# Patient Record
Sex: Female | Born: 1965 | Race: White | Hispanic: No | Marital: Married | Smoking: Never smoker
Health system: Southern US, Community
[De-identification: ages and names within clinical notes are randomized; demographics above are authoritative.]

## PROBLEM LIST (undated history)

## (undated) DIAGNOSIS — Z8669 Personal history of other diseases of the nervous system and sense organs: Secondary | ICD-10-CM

## (undated) DIAGNOSIS — G40909 Epilepsy, unspecified, not intractable, without status epilepticus: Secondary | ICD-10-CM

## (undated) DIAGNOSIS — I2699 Other pulmonary embolism without acute cor pulmonale: Secondary | ICD-10-CM

## (undated) DIAGNOSIS — R569 Unspecified convulsions: Secondary | ICD-10-CM

## (undated) DIAGNOSIS — H3530 Unspecified macular degeneration: Secondary | ICD-10-CM

## (undated) DIAGNOSIS — H409 Unspecified glaucoma: Secondary | ICD-10-CM

## (undated) HISTORY — DX: Unspecified macular degeneration: H35.30

## (undated) HISTORY — PX: PARTIAL HYSTERECTOMY: SHX80

## (undated) HISTORY — DX: Unspecified glaucoma: H40.9

## (undated) HISTORY — DX: Other pulmonary embolism without acute cor pulmonale: I26.99

## (undated) HISTORY — DX: Unspecified convulsions: R56.9

## (undated) HISTORY — PX: TUBAL LIGATION: SHX77

## (undated) HISTORY — DX: Personal history of other diseases of the nervous system and sense organs: Z86.69

---

## 2021-05-23 ENCOUNTER — Encounter: Payer: Self-pay | Admitting: Neurology

## 2021-07-01 ENCOUNTER — Ambulatory Visit (INDEPENDENT_AMBULATORY_CARE_PROVIDER_SITE_OTHER): Payer: 59 | Admitting: Neurology

## 2021-07-01 ENCOUNTER — Other Ambulatory Visit: Payer: 59

## 2021-07-01 ENCOUNTER — Encounter: Payer: Self-pay | Admitting: Neurology

## 2021-07-01 ENCOUNTER — Other Ambulatory Visit: Payer: Self-pay

## 2021-07-01 VITALS — BP 128/88 | HR 67 | Ht 64.0 in | Wt 168.0 lb

## 2021-07-01 DIAGNOSIS — G40309 Generalized idiopathic epilepsy and epileptic syndromes, not intractable, without status epilepticus: Secondary | ICD-10-CM

## 2021-07-01 DIAGNOSIS — R519 Headache, unspecified: Secondary | ICD-10-CM

## 2021-07-01 DIAGNOSIS — H53462 Homonymous bilateral field defects, left side: Secondary | ICD-10-CM

## 2021-07-01 LAB — COMPREHENSIVE METABOLIC PANEL
ALT: 10 U/L (ref 0–35)
AST: 16 U/L (ref 0–37)
Albumin: 4.2 g/dL (ref 3.5–5.2)
Alkaline Phosphatase: 69 U/L (ref 39–117)
BUN: 11 mg/dL (ref 6–23)
CO2: 26 mEq/L (ref 19–32)
Calcium: 9.2 mg/dL (ref 8.4–10.5)
Chloride: 99 mEq/L (ref 96–112)
Creatinine, Ser: 0.66 mg/dL (ref 0.40–1.20)
GFR: 97.94 mL/min (ref >60.00)
Glucose, Bld: 86 mg/dL (ref 70–99)
Potassium: 4 mEq/L (ref 3.5–5.1)
Sodium: 133 mEq/L — ABNORMAL LOW (ref 135–145)
Total Bilirubin: 0.4 mg/dL (ref 0.2–1.2)
Total Protein: 6.7 g/dL (ref 6.0–8.3)

## 2021-07-01 LAB — CBC
HCT: 36.3 % (ref 36.0–46.0)
Hemoglobin: 12.6 g/dL (ref 12.0–15.0)
MCHC: 34.7 g/dL (ref 30.0–36.0)
MCV: 89.6 fl (ref 78.0–100.0)
Platelets: 267 10*3/uL (ref 150.0–400.0)
RBC: 4.05 Mil/uL (ref 3.87–5.11)
RDW: 12.6 % (ref 11.5–15.5)
WBC: 4.8 10*3/uL (ref 4.0–10.5)

## 2021-07-01 MED ORDER — OXCARBAZEPINE 600 MG PO TABS: 600.0000 mg | ORAL_TABLET | Freq: Two times a day (BID) | ORAL | 3 refills | Status: DC

## 2021-07-01 MED ORDER — LAMOTRIGINE 100 MG PO TABS: 100.0000 mg | ORAL_TABLET | Freq: Two times a day (BID) | ORAL | 3 refills | Status: DC

## 2021-07-01 MED ORDER — NORTRIPTYLINE HCL 25 MG PO CAPS: 25.0000 mg | ORAL_CAPSULE | Freq: Every day | ORAL | 6 refills | Status: DC

## 2021-07-01 MED ORDER — TOPIRAMATE 50 MG PO TABS: 150.0000 mg | ORAL_TABLET | Freq: Two times a day (BID) | ORAL | 3 refills | Status: DC

## 2021-07-01 NOTE — Progress Notes (Signed)
NEUROLOGY CONSULTATION NOTE  Laurie Silva MRN: [id number] DOB: [birth year]  Referring provider: Dr. Rolene Arbour Primary care provider: [facility]  Reason for consult:  establish epilepsy care  Dear Dr Tera Helper:  Thank you for your kind referral of Laurie Silva for consultation of the above symptoms. Although her history is well known to you, please allow me to reiterate it for the purpose of our medical record. She is alone in the office today. Records and images were personally reviewed where available.   HISTORY OF PRESENT ILLNESS: This is a 55 year old right-handed woman with a history of pulmonary embolism, migraines, and generalized epilepsy, presenting to establish care. Records from her epileptologist Dr. Tera Helper were reviewed. She was previously followed by Dr. Jac Canavan and Dr. Layla Barter. Seizures started at age 31. She had staring spells as a child. She denies any myoclonic jerks. No warning prior to her convulsions, she states her last seizure was in 2008. EMU evaluation in 2007 was abnormal with generalized spike and wave discharges, at which time one GTCS was captured during the admission. There is a brain MRI with and without contrast done 06/2019 which was normal. She has tried multiple ASMs in the past, including Dilantin (ineffective), Phenobarbital (ineffective), Keppra (ineffective), Tegretol (initially worked), Neurontin, Zonegran, Zarontin, Depakote (rash), and Lamictal (rash). She had a long history of being on brand name medication but switched to generics due to economic issues with no side effects or breakthrough seizures. She has been on chronic treatment with combination Lamotrigine 100mg  BID, Oxcarbazepine 600mg  BID, and Topiramate 50mg  3 tabs BID (150mg  BID) without side effects. We discussed note on her chart of last seizure being in 2019, and clarified that it was not a seizure, she had a viral illness at that time and Dr. ' note in 12/2017  indicated "history most compatible with syncope related to viral illness and neuropathy making her unstable."  She lives with her husband and is his primary caregiver causing significant amount of stress. She denies any staring/unresponsive episodes, gaps in time, olfactory/gustatory hallucinations, deja vu, rising epigastric sensation, focal numbness/tingling/weakness, myoclonic jerks. She has a history of migraines and has had chronic daily headaches for at least a year, taking 2 Tylenol every night for the past year. Pain is diffuse, there is no associated nausea/vomiting. When pain is severe, she has sensitivity to lights and sounds. She has a headache now. She has occasional dizziness. She states her back always hurts. No bowel/bladder dysfunction. She brings notes from Dr. 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 Memory is "just like any 55 year old." She denies missing medications regularly, denies getting lost driving. She states there is a lot going on at home, she does not sleep, if she gets 3 hours of sleep she is good. 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   Epilepsy Risk Factors:  Her daughter had seizures at age 71 that she outgrew, her 79 year old granddaughter has epilepsy. She recalls a pneumonia with a fever of 107 at age 21 but does not recall febrile convulsions. She had a normal birth and early development.  There is no history of CNS infections such as meningitis/encephalitis, significant traumatic brain injury, neurosurgical procedures  Prior ASMs: Dilantin (ineffective), Phenobarbital (ineffective), Keppra (ineffective), Tegretol (initially worked), Neurontin, Zonegran, Zarontin, Depakote (rash), and Lamictal (rash)  Diagnostic Data: EEGs:EMU evaluation in  2007 was abnormal with generalized spike and wave discharges, at which time one GTCS was  captured during the admission.  LNL:GXQJJ is a brain MRI with and without contrast done 06/2019 which was normal.    PAST MEDICAL HISTORY: Past Medical History:  Diagnosis Date   Glaucoma    Hx of migraines    Macular degeneration    Pulmonary embolism (HCC)    Seizures (HCC)     PAST SURGICAL HISTORY: Past Surgical History:  Procedure Laterality Date   PARTIAL HYSTERECTOMY     TUBAL LIGATION      MEDICATIONS: Current Outpatient Medications on File Prior to Visit  Medication Sig Dispense Refill   aspirin 81 MG chewable tablet Chew 81 mg by mouth daily.     lamoTRIgine (LAMICTAL) 100 MG tablet Take 100 mg by mouth 2 (two) times daily.     oxcarbazepine (TRILEPTAL) 600 MG tablet Take 600 mg by mouth 2 (two) times daily.     topiramate (TOPAMAX) 50 MG tablet Take 150 mg by mouth 2 (two) times daily.     No current facility-administered medications on file prior to visit.    ALLERGIES: Allergies  Allergen Reactions   Brinzolamide-Brimonidine Anaphylaxis    itching itching itching itching    Neomycin Anaphylaxis and Swelling   Valproic Acid Hives, Other (See Comments) and Rash    insomnia insomnia insomnia insomnia insomnia insomnia insomnia insomnia insomnia insomnia insomnia insomnia    Cefaclor Rash    Other reaction(s): Unknown   Hydrocodone-Acetaminophen Rash   Latex Rash   Phenobarbital Other (See Comments)    agitation agitation agitation agitation agitation agitation agitation agitation     FAMILY HISTORY: Family History  Problem Relation Age of Onset   Breast cancer Mother    Macular degeneration Mother    Dementia Father    Diabetes Father     SOCIAL HISTORY: Social History   Socioeconomic History   Marital status: Married    Spouse name: Not on file   Number of children: Not on file   Years of education: Not on file   Highest education level: Not on file  Occupational History   Not on file  Tobacco Use   Smoking  status: Not on file   Smokeless tobacco: Not on file  Substance and Sexual Activity   Alcohol use: Not on file   Drug use: Not on file   Sexual activity: Not on file  Other Topics Concern   Not on file  Social History Narrative   Not on file   Social Determinants of Health   Financial Resource Strain: Not on file  Food Insecurity: Not on file  Transportation Needs: Not on file  Physical Activity: Not on file  Stress: Not on file  Social Connections: Not on file  Intimate Partner Violence: Not on file     PHYSICAL EXAM: Vitals:   07/01/21 1450  BP: 128/88  Pulse: 67  SpO2: 100%   General: No acute distress Head:  Normocephalic/atraumatic Skin/Extremities: No rash, no edema Neurological Exam: Mental status: alert and oriented to person, place, and time, no dysarthria or aphasia, Fund of knowledge is appropriate.  Remote memory intact. 1/3 delayed recall.  Attention and concentration are normal, 5/5 WORLD backwards. Cranial nerves: CN I: not tested CN II: pupils equal, round and reactive to light, visual fields intact to confrontation CN III, IV, VI:  full range of motion, no nystagmus, no ptosis CN V: facial sensation intact CN VII: upper and lower  face symmetric CN VIII: hearing intact to conversation Bulk & Tone: normal, no fasciculations. Motor: 5/5 throughout with no pronator drift. Sensation: intact to light touch, cold, pin, vibration and joint position sense.  No extinction to double simultaneous stimulation.  Romberg test negative Deep Tendon Reflexes: +2 throughout Cerebellar: no incoordination on finger to nose testing Gait: narrow-based and steady, able to tandem walk adequately. Tremor: none   IMPRESSION: This is a 55 year old right-handed woman with a history of pulmonary embolism, migraines, and generalized epilepsy, presenting to establish care. She has been seizure-free since 2008 on Lamotrigine 100mg  BID, oxcarbazepine 600mg  BID, and Topiramate 150mg   BID, refills sent. She has chronic daily headaches likely with component of medication overuse headaches. She was advised to start reducing intake of Tylenol to 2-3 times a week. We discussed starting a headache preventative medication that helps with sleep and mood, start nortriptyline 25mg  qhs, side effects discussed. She will update our office in a month, we may increase dose if needed. [place] driving laws were discussed with the patient, and she knows to stop driving after a seizure, until 6 months seizure-free. Safety labs with CBC, CMP will be ordered today. Her ophthalmologist noted left inferior quadrantanopsia and recommended that her neurologist order a brain MRI. Follow-up in 6 months, call for any changes.    Thank you for allowing me to participate in the care of this patient. Please do not hesitate to call for any questions or concerns.   , M.D.  CC: Dr. , [facility]

## 2021-07-01 NOTE — Patient Instructions (Signed)
Bloodwork for CBC, CMP  Schedule MRI brain with and without contrast  3. Start nortriptyline 25mg : take 1 capsule every night. Update me in a month on how you are feeling, if still having a lot of headaches and not sleeping, we will plan to increase dose  4. Start weaning down Tylenol to 2-3 a week, otherwise headaches will not get better if Tylenol is taken everyday  5. Follow-up in 6 months, call for any changes   Seizure Precautions: 1. If medication has been prescribed for you to prevent seizures, take it exactly as directed.  Do not stop taking the medicine without talking to your doctor first, even if you have not had a seizure in a long time.   2. Avoid activities in which a seizure would cause danger to yourself or to others.  Don't operate dangerous machinery, swim alone, or climb in high or dangerous places, such as on ladders, roofs, or girders.  Do not drive unless your doctor says you may.  3. If you have any warning that you may have a seizure, lay down in a safe place where you can't hurt yourself.    4.  No driving for 6 months from last seizure, as per [facility].   Please refer to the following link on the Epilepsy Foundation of America's website for more information: http://www.epilepsyfoundation.org/answerplace/Social/driving/drivingu.cfm   5.  Maintain good sleep hygiene. Avoid alcohol.  6.  Contact your doctor if you have any problems that may be related to the medicine you are taking.  7.  Call 911 and bring the patient back to the ED if:        A.  The seizure lasts longer than 5 minutes.       B.  The patient doesn't awaken shortly after the seizure  C.  The patient has new problems such as difficulty seeing, speaking or moving  D.  The patient was injured during the seizure  E.  The patient has a temperature over 102 F (39C)  F.  The patient vomited and now is having trouble breathing

## 2021-07-03 ENCOUNTER — Telehealth: Payer: Self-pay | Admitting: Neurology

## 2021-07-03 NOTE — Telephone Encounter (Signed)
Returned pt call no answer left a voice mail to call office back

## 2021-07-03 NOTE — Telephone Encounter (Signed)
Pt is returning a call to heather about his results

## 2021-07-03 NOTE — Telephone Encounter (Signed)
Pt left message returning a call about results

## 2021-07-03 NOTE — Telephone Encounter (Signed)
Pt called in and was given her lab results bloodwork was overall okay, her counts, liver, and kidney look good. Her sodium level is just minimally low, which can happen when taking oxcarbazepine. She can increase her salt intake to help

## 2021-07-03 NOTE — Telephone Encounter (Signed)
-----   Message from Van Clines, MD sent at 07/01/2021  4:25 PM EDT ----- Pls let her know the bloodwork was overall okay, her counts, liver, and kidney look good. Her sodium level is just minimally low, which can happen when taking oxcarbazepine. She can increase her salt intake to help. Thanks

## 2021-07-03 NOTE — Progress Notes (Signed)
LMOVM to call the office.

## 2021-07-08 ENCOUNTER — Telehealth: Payer: Self-pay | Admitting: Neurology

## 2021-07-08 MED ORDER — NORTRIPTYLINE HCL 25 MG PO CAPS: 50.0000 mg | ORAL_CAPSULE | Freq: Every day | ORAL | 4 refills | Status: DC

## 2021-07-08 NOTE — Telephone Encounter (Signed)
Pls have her increase to 2 caps qhs, pls send in updated Rx, thanks

## 2021-07-08 NOTE — Telephone Encounter (Signed)
Pt called to let aquino know how she was tolerating nortriptyline 25mg . She takes it at night, sleeps about 2 hours then she is awake the rest of the night. Not helping with HA or sleep.

## 2021-07-08 NOTE — Telephone Encounter (Signed)
Pt called informed to increase Nortriptyline  to 2 caps qhs

## 2021-07-08 NOTE — Addendum Note (Signed)
Addended by: Dimas Chyle on: 07/08/2021 10:33 AM   Modules accepted: Orders

## 2021-07-22 ENCOUNTER — Other Ambulatory Visit: Payer: Self-pay

## 2021-07-22 ENCOUNTER — Ambulatory Visit (INDEPENDENT_AMBULATORY_CARE_PROVIDER_SITE_OTHER): Payer: 59

## 2021-07-22 ENCOUNTER — Encounter: Payer: Self-pay | Admitting: Orthopaedic Surgery

## 2021-07-22 ENCOUNTER — Ambulatory Visit (INDEPENDENT_AMBULATORY_CARE_PROVIDER_SITE_OTHER): Payer: 59 | Admitting: Orthopaedic Surgery

## 2021-07-22 VITALS — BP 116/78 | HR 78 | Ht 63.0 in | Wt 162.0 lb

## 2021-07-22 DIAGNOSIS — M25561 Pain in right knee: Secondary | ICD-10-CM | POA: Diagnosis not present

## 2021-07-22 DIAGNOSIS — G8929 Other chronic pain: Secondary | ICD-10-CM
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
## 2021-07-26 ENCOUNTER — Ambulatory Visit
Admission: RE | Admit: 2021-07-26 | Discharge: 2021-07-26 | Disposition: A | Discharge status: Home / Self Care | Payer: 59 | Source: Ambulatory Visit | Attending: Neurology | Admitting: Neurology

## 2021-07-26 ENCOUNTER — Other Ambulatory Visit: Payer: Self-pay

## 2021-07-26 DIAGNOSIS — H53462 Homonymous bilateral field defects, left side: Secondary | ICD-10-CM

## 2021-07-26 DIAGNOSIS — G40309 Generalized idiopathic epilepsy and epileptic syndromes, not intractable, without status epilepticus: Secondary | ICD-10-CM

## 2021-07-26 DIAGNOSIS — R519 Headache, unspecified: Secondary | ICD-10-CM

## 2021-07-26 MED ORDER — GADOBENATE DIMEGLUMINE 529 MG/ML IV SOLN
15.0000 mL | Freq: Once | INTRAVENOUS | Status: AC | PRN
  Administered 2021-07-26: 15 mL via INTRAVENOUS

## 2021-07-28 ENCOUNTER — Telehealth: Payer: Self-pay

## 2021-07-28 MED ORDER — LIDOCAINE HCL 1 % IJ SOLN
0.5000 mL | INTRAMUSCULAR | Status: AC | PRN
Start: 2021-07-22 — End: 2021-07-22
  Administered 2021-07-22: .5 mL

## 2021-07-28 MED ORDER — METHYLPREDNISOLONE ACETATE 40 MG/ML IJ SUSP
40.0000 mg | INTRAMUSCULAR | Status: AC | PRN
Start: 2021-07-22 — End: 2021-07-22
  Administered 2021-07-22: 40 mg via INTRA_ARTICULAR

## 2021-07-28 MED ORDER — BUPIVACAINE HCL 0.25 % IJ SOLN
4.0000 mL | INTRAMUSCULAR | Status: AC | PRN
Start: 2021-07-22 — End: 2021-07-22
  Administered 2021-07-22: 4 mL via INTRA_ARTICULAR

## 2021-07-28 NOTE — Telephone Encounter (Signed)
-----   Message from Van Clines, MD sent at 07/28/2021  8:28 AM EST ----- Pls let her know that the brain MRI looked good, no evidence of tumor, stroke, or bleed. If she wants [place] to fax the results to her eye doctor Dr. Arville Go, does she need to sign release? thanks

## 2021-07-28 NOTE — Telephone Encounter (Signed)
Pt called and informed MRI looked good, no evidence of tumor, stroke, or bleed. She will come by Thursday to sign a release of information

## 2021-07-29 ENCOUNTER — Telehealth: Payer: Self-pay | Admitting: Neurology

## 2021-07-29 MED ORDER — NORTRIPTYLINE HCL 25 MG PO CAPS: 75.0000 mg | ORAL_CAPSULE | Freq: Every day | ORAL | 3 refills | Status: DC

## 2021-07-29 NOTE — Telephone Encounter (Signed)
Pt stated that she is not taken any tylenol at all ,

## 2021-07-29 NOTE — Addendum Note (Signed)
Addended by: Dimas Chyle on: 07/29/2021 12:58 PM   Modules accepted: Orders

## 2021-07-29 NOTE — Telephone Encounter (Signed)
Patient called and left a message reporting she is still up at night with migraines.   She'd like to know if Dr. Karel Jarvis will increase her medication again?

## 2021-07-29 NOTE — Telephone Encounter (Signed)
Would increase nortriptyline 25mg : take 3 caps every night. Has she been able to cut down on daily Tylenol use? If not, whatever we do will not help, would continue with cutting down to 2-3 a week.

## 2021-07-29 NOTE — Telephone Encounter (Signed)
Pt called and informed that Dr Karel Jarvis would like for her to increase nortriptyline 25mg : take 3 caps every night

## 2021-08-20 ENCOUNTER — Telehealth: Payer: Self-pay | Admitting: Neurology

## 2021-08-20 NOTE — Telephone Encounter (Signed)
Pt needs a call back to discuss some things that her eye doc put her on. She wants to make sure she can take the meds she was put on.  make sure it wont interfere.

## 2021-08-20 NOTE — Telephone Encounter (Signed)
Pt wants to make sure that latanoprost (XALATAN) 0.005 % ophthalmic solution  will not interfere with her nortriptyline and lamotrigine

## 2021-08-20 NOTE — Telephone Encounter (Signed)
Pt called to go over medications started by the eye Dr no answer left a voice mail to call the office back

## 2021-08-20 NOTE — Telephone Encounter (Signed)
Pt called an informed that she should be ok to use her eye drops taken her medication per [facility]

## 2021-09-01 ENCOUNTER — Telehealth: Payer: Self-pay | Admitting: Neurology

## 2021-09-01 NOTE — Telephone Encounter (Signed)
How frequent or the headaches (on average, how many days a week/month are they occurring)?  Since seeing Dr.AQunio pt states she had at least three in November. The one Saturday lasted all day. Per husband she woke up screaming from her sleep. Patient do not remember this. " The worst headache I've had in my life." Yesterday and today the headache has gotten better since taking the 3 500 mg  tylenol.  How long do the headaches last?  All day Verify what preventative medication and dose you are taking (e.g. topiramate, propranolol, amitriptyline, Emgality, etc)  Nortriptyline 75 mg  Verify which rescue medication you are taking (triptan, Advil, Excedrin, Aleve, Ubrelvy, etc)  Tylenol 500 mg  How often are you taking pain relievers/analgesics/rescue mediction?  Not often since starting Nortriptyline 75 mg

## 2021-09-01 NOTE — Telephone Encounter (Signed)
Pt called in stating her head started hurting on Saturday evening. Her husband told her she woke up screaming due to her head hurting so back. She says she doesn't remember this. She did take 3 tylenol after that even though she isn't supposed to. Her eye doctor put her on eye drops due to her glaucoma getting worse.

## 2021-09-02 NOTE — Telephone Encounter (Signed)
Has she tried a steroid taper in the past to help break her headaches? If no problems with prednisone in the past, we can try Prednisone 20mg : Take 3 tabs on day 1, 2-1/2 tabs on day 2, 2 tabs on day 3, 1-1/2 tabs on day 4, 1 tab on day 5, 1/2 tab on days 6 and 7, then stop. (Dispense #11 tabs no refills). Pls send rx if she agrees and also Rx for nortriptyline 50mg : Take 2 caps qhs. Thanks

## 2021-09-02 NOTE — Telephone Encounter (Signed)
Pt called she stated she didn't sleep well her head is still hurting. She said that she has been doing good until last week it started hurting more often, pt agrees to increase her nortriptyline to 100mg  daily at bedtime

## 2021-09-02 NOTE — Telephone Encounter (Signed)
Can you pls check on how she is doing? Is she still having headache? How often are the migraines now? We can increase nortriptyline to 100mg  qhs if she is not having side effects. Thanks

## 2021-09-02 NOTE — Telephone Encounter (Signed)
Pt stated that she dose not want to do the steroid taper for her headache at this time and that she does not need a refill on her nortriptyline has a bunch she will call when what she has get low,

## 2021-10-15 ENCOUNTER — Telehealth: Payer: Self-pay | Admitting: Neurology

## 2021-10-15 NOTE — Telephone Encounter (Signed)
Patient came by the office this morning with her husband requesting help with some symptoms that started yesterday and that she is very concerned about.  She said she'd like to speak with a clinical staff member to go over this some more, if possible.  Patient stated her mouth has been very dry and yesterday she had an incident where suddenly she felt very confused and drunk-like.  She said it is all related to a headache that has been ongoing and she still has today.  Please advise?

## 2021-10-15 NOTE — Telephone Encounter (Signed)
Went and spoke with pt and her husband per husband she had 2 episodes yesterday where she became pale was unable to walk when she tried it was like she was "drunk" she had a really bad head headache. Pt husband state he though maybe her blood sugar was low so he fixed her something to eat he thought it helped some but was not sure. Pt stated that she still has a headache this morning she is tearful and eyes are red. Pt was advised to go across the street to the ER to be evaluated. Pt was advised that I would sent this noted to Dr Delice Lesch so she would be aware of what is going on,

## 2021-10-29 ENCOUNTER — Telehealth: Payer: Self-pay | Admitting: Neurology

## 2021-10-29 MED ORDER — NORTRIPTYLINE HCL 25 MG PO CAPS: 75.0000 mg | ORAL_CAPSULE | Freq: Every day | ORAL | 1 refills | Status: DC

## 2021-10-29 NOTE — Telephone Encounter (Signed)
Patient left a message stating that she needs a new refill sent for the medication nortriptyline.  Her dosage was increased and she is almost out of her other one.  She uses [facility] in [place].

## 2021-10-29 NOTE — Telephone Encounter (Signed)
Refill sent in for pt. 

## 2021-10-31 ENCOUNTER — Telehealth: Payer: Self-pay | Admitting: Neurology

## 2021-10-31 MED ORDER — NORTRIPTYLINE HCL 50 MG PO CAPS: 100.0000 mg | ORAL_CAPSULE | Freq: Every day | ORAL | 1 refills | Status: DC

## 2021-10-31 NOTE — Telephone Encounter (Signed)
Patient is returning a call to heather 

## 2021-10-31 NOTE — Telephone Encounter (Signed)
Pt called no answer left a message that nortriptyline was sent in for 50 mg take 2 tablets at night not 4 tablets at night it was also noted on pt prescription to only take 2 tablets at night,

## 2021-10-31 NOTE — Telephone Encounter (Signed)
Patient called and stated that she needed a prescription for nortriptyline.  Her dosage was moved up to 4 pills at night and she is out of them.

## 2021-10-31 NOTE — Telephone Encounter (Signed)
Spoke with pt an informed her that nortriptyline was sent in for 50 mg take 2 tablets at night not 4 tablets at night

## 2021-12-31 ENCOUNTER — Other Ambulatory Visit (INDEPENDENT_AMBULATORY_CARE_PROVIDER_SITE_OTHER): Payer: 59

## 2021-12-31 ENCOUNTER — Ambulatory Visit: Payer: 59 | Admitting: Neurology

## 2021-12-31 ENCOUNTER — Encounter: Payer: Self-pay | Admitting: Neurology

## 2021-12-31 VITALS — BP 108/73 | HR 84 | Ht 63.0 in | Wt 172.0 lb

## 2021-12-31 DIAGNOSIS — R519 Headache, unspecified: Secondary | ICD-10-CM

## 2021-12-31 DIAGNOSIS — G40309 Generalized idiopathic epilepsy and epileptic syndromes, not intractable, without status epilepticus: Secondary | ICD-10-CM

## 2021-12-31 LAB — BASIC METABOLIC PANEL
BUN: 19 mg/dL (ref 6–23)
CO2: 26 mEq/L (ref 19–32)
Calcium: 8.8 mg/dL (ref 8.4–10.5)
Chloride: 101 mEq/L (ref 96–112)
Creatinine, Ser: 0.72 mg/dL (ref 0.40–1.20)
GFR: 93.02 mL/min (ref >60.00)
Glucose, Bld: 74 mg/dL (ref 70–99)
Potassium: 4.4 mEq/L (ref 3.5–5.1)
Sodium: 133 mEq/L — ABNORMAL LOW (ref 135–145)

## 2021-12-31 MED ORDER — DULOXETINE HCL 30 MG PO CPEP: ORAL_CAPSULE | ORAL | 6 refills | Status: DC

## 2021-12-31 MED ORDER — NORTRIPTYLINE HCL 25 MG PO CAPS: ORAL_CAPSULE | ORAL | 0 refills | Status: DC

## 2021-12-31 NOTE — Patient Instructions (Addendum)
Bloodwork for BMP ? ?2. Let's switch to Cymbalta. Start Cymbalta 30mg : take 1 capsule every night for 1 week, then increase to 2 capsules every night ? ?3. With your nortriptyline 50mg : take 1 capsule every night for 4 nights, then reduce to 25mg  1 capsule every night for 3 nights, then stop medication ? ?4. Set up appointment with our social worker Misty ? ?5. Follow-up in 3 months, call for any changes ? ? ?Seizure Precautions: ?1. If medication has been prescribed for you to prevent seizures, take it exactly as directed.  Do not stop taking the medicine without talking to your doctor first, even if you have not had a seizure in a long time.  ? ?2. Avoid activities in which a seizure would cause danger to yourself or to others.  Don't operate dangerous machinery, swim alone, or climb in high or dangerous places, such as on ladders, roofs, or girders.  Do not drive unless your doctor says you may. ? ?3. If you have any warning that you may have a seizure, lay down in a safe place where you can't hurt yourself.   ? ?4.  No driving for 6 months from last seizure, as per [facility].   Please refer to the following link on the [place] website for more information: http://www.epilepsyfoundation.org/answerplace/Social/driving/drivingu.cfm  ? ?5.  Maintain good sleep hygiene. Avoid alcohol. ? ?6.  Contact your doctor if you have any problems that may be related to the medicine you are taking. ? ?7.  Call 911 and bring the patient back to the ED if: ?      ? A.  The seizure lasts longer than 5 minutes.      ? B.  The patient doesn't awaken shortly after the seizure ? C.  The patient has new problems such as difficulty seeing, speaking or moving ? D.  The patient was injured during the seizure ? E.  The patient has a temperature over 102 F (39C) ? F.  The patient vomited and now is having trouble breathing ?      ?Your provider has requested that you have labwork completed today. The  lab is located on the Second floor at [place], within the [facility] Endocrinology office. When you get off the elevator, turn right and go in the [facility] Endocrinology [address]; the first brown door on the left.  Tell the ladies behind the desk that you are there for lab work. If you are not called within 15 minutes please check with the front desk.  ? ?Once you complete your labs you are free to go. You will receive a call or message via MyChart with your lab results.    ?

## 2021-12-31 NOTE — Progress Notes (Signed)
? ?NEUROLOGY FOLLOW UP OFFICE NOTE ? ?Michail SermonKimberly E Silva ?[id number] ?1965/12/28 ? ?HISTORY OF PRESENT ILLNESS: ?I had the pleasure of seeing Laurie Silva in follow-up in the neurology clinic on 12/31/2021.  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 She is alone in the office today. Records and images were personally reviewed where available. Her eye doctor had noted a left inferior quadrantanopsia and requested brain MRI with and without contrast which was done 07/2021 and was normal. [facility]21 showed normal neuro-ophthalmic exam, it was noted that "her recent Goldmann VF shows diffuse VF constriction with crossing of the isopters, which is a non-physiologic response, she is probably not a god visual field taker." She has not had any seizures since 2008 on Lamotrigine 100mg  BID, oxcarbazepine 600mg  BID, and Topiramate 150mg  BID. ? ?She had contacted our office several times since her last visit about continued headaches, nortriptyline started in 06/2021 was increased to 100mg  qhs in 08/2021. She had called our office at that time to report that she woke up screaming with "the worst headache in her life." Initially she was having them once in a blue moon, but recently she has been having spells where she gets really dizzy with a bad headache lasting only a few minutes. She gets a little confused, her husband told her she walked into the bedroom one time and fell on the bed, breaking something on the nightstand. She walked in to our office last 10/15/21 reporting 2 episodes the day prior where she became pale and unable to walk like she was drunk with a really bad headache. She has stopped taking the daily Tylenol. She reports better sleep with the nortriptyline but still wakes up tired. She is also having significant dry mouth. She thinks "it's just stress." She lives with her husband who has early onset dementia and states the  stress of caregiving has gotten so bad. They just stay in the house, he always wants her by his side.  ? ?Lab Results  ?Component Value Date  ? WBC 4.8 07/01/2021  ? HGB 12.6 07/01/2021  ? HCT 36.3 07/01/2021  ? MCV 89.6 07/01/2021  ? PLT 267.0 07/01/2021  ? ?  Chemistry   ?   ?Component Value Date/Time  ? NA 133 (L) 07/01/2021 0957  ? K 4.0 07/01/2021 0957  ? CL 99 07/01/2021 0957  ? CO2 26 07/01/2021 0957  ? BUN 11 07/01/2021 0957  ? CREATININE 0.66 07/01/2021 0957  ?    ?Component Value Date/Time  ? CALCIUM 9.2 07/01/2021 0957  ? ALKPHOS 69 07/01/2021 0957  ? AST 16 07/01/2021 0957  ? ALT 10 07/01/2021 0957  ? BILITOT 0.4 07/01/2021 0957  ?  ? ? ? ? ? ? ?History on Initial Assessment 07/01/2021: This is a 56 year old right-handed woman with a history of pulmonary embolism, migraines, and generalized epilepsy, presenting to establish care. Records from her epileptologist Dr. Tera HelperBoggs were reviewed. She was previously followed by Dr. Jac Canavanravis Jackson and Dr. Layla BarterYuson. Seizures started at age 89. She had staring spells as a child. She denies any myoclonic jerks. No warning prior to her convulsions, she states her last seizure was in 2008. EMU evaluation in 2007 was abnormal with generalized spike and wave discharges, at which time one GTCS was captured during the admission. There is a brain MRI with and without contrast done 06/2019 which was normal. She has tried multiple ASMs in the past, including  Dilantin (ineffective), Phenobarbital (ineffective), Keppra (ineffective), Tegretol (initially worked), Neurontin, Zonegran, Zarontin, Depakote (rash), and Lamictal (rash). She had a long history of being on brand name medication but switched to generics due to economic issues with no side effects or breakthrough seizures. She has been on chronic treatment with combination Lamotrigine 100mg  BID, Oxcarbazepine 600mg  BID, and Topiramate 50mg  3 tabs BID (150mg  BID) without side effects. We discussed note on her chart of last  seizure being in 2019, and clarified that it was not a seizure, she had a viral illness at that time and Dr. ' note in 12/2017 indicated "history most compatible with syncope related to viral illness and neuropathy making her unstable." ? ?She lives with her husband and is his primary caregiver causing significant amount of stress. She denies any staring/unresponsive episodes, gaps in time, olfactory/gustatory hallucinations, deja vu, rising epigastric sensation, focal numbness/tingling/weakness, myoclonic jerks. She has a history of migraines and has had chronic daily headaches for at least a year, taking 2 Tylenol every night for the past year. Pain is diffuse, there is no associated nausea/vomiting. When pain is severe, she has sensitivity to lights and sounds. She has a headache now. She has occasional dizziness. She states her back always hurts. No bowel/bladder dysfunction. She brings notes from Dr. 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 Memory is "just like any 56 year old." She denies missing medications regularly, denies getting lost driving. She states there is a lot going on at home, she does not sleep, if she gets 3 hours of sleep she is good. 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  ? ?Epilepsy Risk Factors:  Her daughter had seizures at age 80 that she outgrew, her 56 year old granddaughter has epilepsy. She recalls a pneumonia with a fever of 107 at age 61 but does not recall febrile convulsions. She had a normal birth and early development.  There is no history of CNS infections such as meningitis/encephalitis, significant traumatic brain injury, neurosurgical procedures ? ?Prior ASMs: Dilantin (ineffective), Phenobarbital (ineffective), Keppra (ineffective), Tegretol (initially worked), Neurontin, Zonegran, Zarontin, Depakote (rash), and Lamictal  (rash) ?Prior headache preventative medications: Depakote, Zonisamide, Neurontin ? ? ?Diagnostic Data: ?EEGs:EMU evaluation in 2007 was abnormal with generalized spike and wave discharges, at which time one GTCS was captured during the admission.  ?10 is a brain MRI with and without contrast done 06/2019 which was normal.  ? ? ?PAST MEDICAL HISTORY: ?Past Medical History:  ?Diagnosis Date  ? Glaucoma   ? Hx of migraines   ? Macular degeneration   ? Pulmonary embolism (HCC)   ? Seizures (HCC)   ? ? ?MEDICATIONS: ?Current Outpatient Medications on File Prior to Visit  ?Medication Sig Dispense Refill  ? aspirin 81 MG chewable tablet Chew 81 mg by mouth daily.    ? lamoTRIgine (LAMICTAL) 100 MG tablet Take 1 tablet (100 mg total) by mouth 2 (two) times daily. 180 tablet 3  ? nortriptyline (PAMELOR) 50 MG capsule Take 2 capsules (100 mg total) by mouth at bedtime. **NOTE DOSE CHANGE** 60 capsule 1  ? oxcarbazepine (TRILEPTAL) 600 MG tablet Take 1 tablet (600 mg total) by mouth 2 (two) times daily. 180 tablet 3  ? topiramate (TOPAMAX) 50 MG tablet Take 3 tablets (150 mg total) by mouth 2 (two) times daily. 540 tablet 3  ? ?No current facility-administered medications on file prior to  visit.  ? ? ?ALLERGIES: ?Allergies  ?Allergen Reactions  ? Brinzolamide-Brimonidine Anaphylaxis  ?  itching ?itching ?itching ?itching ?  ? Neomycin Anaphylaxis and Swelling  ? Valproic Acid Hives, Other (See Comments) and Rash  ?  insomnia ?insomnia ?insomnia ?insomnia ?insomnia ?insomnia ?insomnia ?insomnia ?insomnia ?insomnia ?insomnia ?insomnia ?  ? Cefaclor Rash  ?  Other reaction(s): Unknown  ? Hydrocodone-Acetaminophen Rash  ? Latex Rash  ? Phenobarbital Other (See Comments)  ?  agitation ?agitation ?agitation ?agitation ?agitation ?agitation ?agitation ?agitation ?  ? ? ?FAMILY HISTORY: ?Family History  ?Problem Relation Age of Onset  ? Breast cancer Mother   ? Macular degeneration Mother   ? Stroke Father   ? Dementia Father    ? Diabetes Father   ? Glaucoma Father   ? ? ?SOCIAL HISTORY: ?Social History  ? ?Socioeconomic History  ? Marital status: Married  ?  Spouse name: Not on file  ? Number of children: Not on file  ? Years of education: Not o

## 2022-01-07 ENCOUNTER — Ambulatory Visit: Payer: 59 | Admitting: Licensed Clinical Social Worker

## 2022-01-07 ENCOUNTER — Telehealth: Payer: Self-pay | Admitting: Neurology

## 2022-01-07 DIAGNOSIS — F4321 Adjustment disorder with depressed mood: Secondary | ICD-10-CM

## 2022-01-07 NOTE — Telephone Encounter (Signed)
Patient in the office for an appointment with [facility] today, the social worker. ? ?While here, she wanted Dr. Delice Lesch to be aware she has had a migraine since last Saturday, 01/03/22. ? ?As of this morning, is is just starting to subside. ?

## 2022-01-07 NOTE — BH Specialist Note (Signed)
Integrated Behavioral Health Initial In-Person Visit ? ?MRN: [id number] ?Name: Laurie Silva ? ?Number of Integrated Behavioral Health Clinician visits: No data recorded ?Session Start time: 0820 ?   ?Session End time: 0915 ? ?Total time in minutes: 55 ? ? ?Types of Service: Individual psychotherapy ? ?Interpretor:No. Interpretor Name and Language: NA ? ? Warm Hand Off Completed. ? ?  ? ?  ? ? ?Subjective: ?[name] is a 56 y.o. female accompanied by  Self ?Patient was referred by Dr. Patrcia Dolly for Caregiver Distress. ?Patient reports the following symptoms/concerns: Feelings of being overwhelmed and responsibilities entailed in caring for spouse, voiced depressed mood and anxiety by caring and change in role with caring for spouse, decline in health due to physical and emotional strain of caregiver   ?Duration of problem: Several months ; Severity of problem: moderate ? ?Objective: ?Mood: NA and Affect: Appropriate ?Risk of harm to self or others: No plan to harm self or others ? ?Life Context: ?Family and Social: Pt is young and is currently taking care of husband who has significant medical issues including young onset dementia  ?School/Work: Pt is not working due to caregiver challenges of not being able to leave husband alone   ?Self-Care: Pt reports health has decline and not able to attend to self care needs  ?Life Changes: Role has significantly changed with caring for spouse with his needs and being a caregiver  ? ?Patient and/or Family's Strengths/Protective Factors: ?Social connections ? ?Goals Addressed: ?Patient will: ?Reduce symptoms of: anxiety, depression, and stress ?Increase knowledge and/or ability of: coping skills and stress reduction  ?Demonstrate ability to: Increase healthy adjustment to current life circumstances and Increase adequate support systems for patient/family ? ?Progress towards Goals: ?Ongoing ? ?Interventions: ?Interventions utilized: Solution-Focused Strategies,  Psychoeducation and/or Health Education, and Link to [facility]  ?Standardized Assessments completed: Not Needed ? ?Patient and/or Family Response: Pt open to interventions discussed   ? ?Patient Centered Plan: ?Patient is on the following Treatment Plan(s):  Cope effectively with the demands of caregiver while enjoying the rewards of the role. Gain knowledge of the conditions, and challenges that lie ahead, maximize the use of formal and informal support.  ? ?Assessment: ?Patient currently experiencing Feelings of being overwhelmed by the duties of being a caregiver, feelings of despair and reports depressed mood and anxiety associated with caring for her spouse.  She reports decline physical health and emotional strains of caregiving . ?  ?Patient may benefit from Cope effectively with the demands of caregiver while enjoying the rewards of the role. Gain knowledge of the conditions, and challenges that lie ahead, maximize the use of formal and informal support. . ? ?Plan: ?Follow up with behavioral health clinician on : In two weeks or as needed  ?Behavioral recommendations: Follow up with community resources and and information provided on self care for the caregiver  ?Referral(s): Community Resources:  Support Group and [facility]  ?"From scale of 1-10, how likely are you to follow plan?": 10 ? ?[name], LCSW ? ? ? ? ? ? ? ? ?

## 2022-01-07 NOTE — Telephone Encounter (Signed)
Noted, thanks!

## 2022-01-14 ENCOUNTER — Telehealth: Payer: Self-pay | Admitting: Neurology

## 2022-01-14 NOTE — Telephone Encounter (Signed)
Patient called and stated that she had a medication change at her last visit.  She stated that her migraines have come back and they are horrible, and she is not sleeping at night.   ?

## 2022-01-15 NOTE — Telephone Encounter (Signed)
Does she want to go back to the nortriptyline? Main issue is she was having significant dry mouth and still having headaches ?

## 2022-01-19 MED ORDER — DULOXETINE HCL 30 MG PO CPEP: ORAL_CAPSULE | ORAL | 6 refills | Status: DC

## 2022-01-19 NOTE — Telephone Encounter (Signed)
Called patient and informed her of Dr. Karel Jarvis recommendations to increase Cymbalta to 3 capsules every night. Informed patient to give it sometime to get in her system to work. Patient verbalized understanding and states she will need a prescription sent. Informed patient I will let Dr. Karel Jarvis know and we will get her medication sent in.  ?

## 2022-01-19 NOTE — Telephone Encounter (Signed)
Called patient and she stated that her Migraines are still very bad. She states that she does not want to try the Nortriptyline because the dry mouth was terrible. Patient wanted to know of any other recommendations that Dr. Karel Jarvis may have? Informed patient that I will send her message and give her a call back as soon as I hear back. ?

## 2022-01-19 NOTE — Telephone Encounter (Signed)
Rx sent to [facility], thanks! ?

## 2022-01-19 NOTE — Telephone Encounter (Signed)
If no side effects, would increase Cymbalta 30mg : take 3 caps every night. She does have to give it some time to get in her system as well, thanks ?

## 2022-01-28 ENCOUNTER — Ambulatory Visit: Payer: 59 | Admitting: Licensed Clinical Social Worker

## 2022-01-28 ENCOUNTER — Other Ambulatory Visit (HOSPITAL_COMMUNITY): Payer: Self-pay

## 2022-01-28 ENCOUNTER — Telehealth: Payer: Self-pay | Admitting: Neurology

## 2022-01-28 NOTE — Telephone Encounter (Signed)
Per patient VM that [facility] pharmacy sent something to [place] abut why she needed some many pills of the Cymbalta. The RX was changed from 2 pill to 3 pill a night. She is out of the medication today , she wants to make sure we got the  information from the [place] pharmacy  please call patient  ?

## 2022-02-03 ENCOUNTER — Telehealth (HOSPITAL_COMMUNITY): Payer: Self-pay | Admitting: Pharmacy Technician

## 2022-02-03 ENCOUNTER — Other Ambulatory Visit (HOSPITAL_COMMUNITY): Payer: Self-pay

## 2022-02-03 NOTE — Telephone Encounter (Signed)
Patient Advocate Encounter   Received notification hat prior authorization for DULoxetine HCl 30MG  dr capsules is required.   PA submitted on 02/03/2022 Key [id number] Status is pending       02/05/2022, CPhT Pharmacy Patient Advocate Specialist [facility] Patient Advocate Team Direct Number: [phone]  Fax: [phone]

## 2022-02-06 MED ORDER — DULOXETINE HCL 30 MG PO CPEP: ORAL_CAPSULE | ORAL | 5 refills | Status: DC

## 2022-02-06 MED ORDER — DULOXETINE HCL 60 MG PO CPEP: ORAL_CAPSULE | ORAL | 5 refills | Status: DC

## 2022-02-06 NOTE — Telephone Encounter (Signed)
Pt called no answer left a voice mail to call the office back  °

## 2022-02-06 NOTE — Telephone Encounter (Signed)
Pt looked at her pill bottle and she is taken 3 30mg  tablets , she was ok with taken the 60mg  and 30mg 

## 2022-02-06 NOTE — Telephone Encounter (Signed)
Received a phone call from psychiatrist Dr. Karie Mainland regarding peer-to-peer for her Cymbalta. We discussed that she was instructed to increase Cymbalta 30mg  to 3 caps every night. He states that only 2 capsules are allowed for her Cymbalta, he suggested to prescribed 60mg  + 30mg  every night so that she is only on 2 capsules. He was ok with 90mg  dose. Will send Rx to pharmacy.  Heather, pls let patient know that insurance does not allow 3 capsules, but will allow for her to take a 60mg  capsule and a 30mg  capsule for the same total dose. Pls let her know I sent in Rx for both. thanks

## 2022-02-06 NOTE — Telephone Encounter (Signed)
Pt called in was informed that we Received a phone call from psychiatrist Dr. Karie Mainland regarding peer-to-peer for her Cymbalta. We discussed that she was instructed to increase Cymbalta 30mg  to 3 caps every night. He states that only 2 capsules are allowed for her Cymbalta, he suggested to prescribed 60mg  + 30mg  every night so that she is only on 2 capsules. He was ok with 90mg  dose. Pt was informed that Dr sent in the new prescription , pt stated that she only takes 75mg  and asked of we wanted her to increase it to 90mg . Pt is going to call and let me know the dose on her bottle when she gets home of her Cymbalta,

## 2022-02-17 ENCOUNTER — Other Ambulatory Visit (HOSPITAL_COMMUNITY)
Admission: RE | Admit: 2022-02-17 | Discharge: 2022-02-17 | Disposition: A | Discharge status: Home / Self Care | Payer: 59 | Source: Ambulatory Visit | Attending: Internal Medicine | Admitting: Internal Medicine

## 2022-02-17 DIAGNOSIS — R197 Diarrhea, unspecified: Secondary | ICD-10-CM | POA: Insufficient documentation

## 2022-02-18 ENCOUNTER — Other Ambulatory Visit (HOSPITAL_COMMUNITY)
Admission: RE | Admit: 2022-02-18 | Discharge: 2022-02-18 | Disposition: A | Discharge status: Home / Self Care | Payer: 59 | Source: Ambulatory Visit | Attending: Internal Medicine | Admitting: Internal Medicine

## 2022-02-18 DIAGNOSIS — R197 Diarrhea, unspecified: Secondary | ICD-10-CM | POA: Insufficient documentation

## 2022-02-18 LAB — LACTOFERRIN, FECAL, QUALITATIVE: Lactoferrin, Fecal, Qual: POSITIVE — AB

## 2022-02-18 LAB — LAB REPORT - SCANNED
A1c: 5
EGFR (Non-African Amer.): 93
HM HIV Screening: NEGATIVE
HM Hepatitis Screen: NEGATIVE

## 2022-02-18 LAB — C DIFFICILE QUICK SCREEN W PCR REFLEX
C Diff antigen: NEGATIVE
C Diff interpretation: NOT DETECTED
C Diff toxin: NEGATIVE

## 2022-02-19 LAB — GIARDIA/CRYPTOSPORIDIUM EIA
Cryptosporidium EIA: NEGATIVE
Giardia Ag, Stl: NEGATIVE

## 2022-02-22 LAB — OVA + PARASITE EXAM

## 2022-02-22 LAB — O&P RESULT

## 2022-02-23 LAB — STOOL CULTURE REFLEX - CMPCXR

## 2022-02-23 LAB — STOOL CULTURE REFLEX - RSASHR

## 2022-02-23 LAB — STOOL CULTURE: E coli, Shiga toxin Assay: NEGATIVE

## 2022-03-02 LAB — CALPROTECTIN, FECAL: Calprotectin, Fecal: 30 ug/g (ref 0–120)

## 2022-04-02 ENCOUNTER — Ambulatory Visit: Payer: 59 | Admitting: Neurology

## 2022-04-13 ENCOUNTER — Ambulatory Visit: Payer: 59 | Admitting: Neurology

## 2022-04-13 ENCOUNTER — Encounter: Payer: Self-pay | Admitting: Neurology

## 2022-04-13 VITALS — BP 112/74 | HR 70 | Ht 63.0 in | Wt 156.0 lb

## 2022-04-13 DIAGNOSIS — G43709 Chronic migraine without aura, not intractable, without status migrainosus: Secondary | ICD-10-CM | POA: Diagnosis not present

## 2022-04-13 DIAGNOSIS — G40309 Generalized idiopathic epilepsy and epileptic syndromes, not intractable, without status epilepticus: Secondary | ICD-10-CM

## 2022-04-13 NOTE — Patient Instructions (Signed)
Good to see you doing better. Please check your bottles at home, if you need refills sent to [facility], let me know, otherwise let me know when you need them sent to [facility]. Follow-up in 6 months, call for any changes.   Seizure Precautions: 1. If medication has been prescribed for you to prevent seizures, take it exactly as directed.  Do not stop taking the medicine without talking to your doctor first, even if you have not had a seizure in a long time.   2. Avoid activities in which a seizure would cause danger to yourself or to others.  Don't operate dangerous machinery, swim alone, or climb in high or dangerous places, such as on ladders, roofs, or girders.  Do not drive unless your doctor says you may.  3. If you have any warning that you may have a seizure, lay down in a safe place where you can't hurt yourself.    4.  No driving for 6 months from last seizure, as per [facility].   Please refer to the following link on the [employer]ilepsy [employer] of [employer]'s website for more information: http://www.epilepsyfoundation.org/answerplace/Social/driving/drivingu.cfm   5.  Maintain good sleep hygiene. Avoid alcohol.  6.  Contact your doctor if you have any problems that may be related to the medicine you are taking.  7.  Call 911 and bring the patient back to the ED if:        A.  The seizure lasts longer than 5 minutes.       B.  The patient doesn't awaken shortly after the seizure  C.  The patient has new problems such as difficulty seeing, speaking or moving  D.  The patient was injured during the seizure  E.  The patient has a temperature over 102 F (39C)  F.  The patient vomited and now is having trouble breathing

## 2022-04-13 NOTE — Progress Notes (Signed)
NEUROLOGY FOLLOW UP OFFICE NOTE  DON GIARRUSSO [id number] 1966-01-01  HISTORY OF PRESENT ILLNESS: I had the pleasure of seeing Laurie Silva in follow-up in the neurology clinic on 04/13/2022. She is alone in the office today. The patient was last seen 3 months ago for generalized epilepsy and chronic migraine. Records and images were personally reviewed where available.  She cotninues to deny any seizures since 2008 on Lamotrigine 100mg  BID, oxcarbazepine 600mg  BID, and Topiramate 150mg  BID. On her last visit, she reported continued daily headaches and side effects on nortriptyline (but it did help with sleep). She was switched to Cymbalta, currently on 90mg  qhs (taking 60+30mg  capsule qhs). She states that she is sleeping better at night, it has also helped calm her down. She thinks all her headaches now are stress-related. She is the primary caregiver of her husband with early onset dementia, as well as her mother. They will be moving to [place] to be closer to her mother, she plans to get a job there. She has a touch of headache now and reports interrupted sleep due to worrying. She thinks this will also get better once they move. She naps in the middle of the day sometimes. She denies any dizziness, vision changes, no falls. No side effects on medications.    Chemistry      Component Value Date/Time   NA 133 (L) 12/31/2021 1017   K 4.4 12/31/2021 1017   CL 101 12/31/2021 1017   CO2 26 12/31/2021 1017   BUN 19 12/31/2021 1017   CREATININE 0.72 12/31/2021 1017      Component Value Date/Time   CALCIUM 8.8 12/31/2021 1017   ALKPHOS 69 07/01/2021 0957   AST 16 07/01/2021 0957   ALT 10 07/01/2021 0957   BILITOT 0.4 07/01/2021 0957        History on Initial Assessment 07/01/2021: This is a 56 year old right-handed woman with a history of pulmonary embolism, migraines, and generalized epilepsy, presenting to establish care. Records from her epileptologist Dr. 07/03/2021 were  reviewed. She was previously followed by Dr. 07/03/2021 and Dr. 07/03/2021. Seizures started at age 56. She had staring spells as a child. She denies any myoclonic jerks. No warning prior to her convulsions, she states her last seizure was in 2008. EMU evaluation in 2007 was abnormal with generalized spike and wave discharges, at which time one GTCS was captured during the admission. There is a brain MRI with and without contrast done 06/2019 which was normal. She has tried multiple ASMs in the past, including Dilantin (ineffective), Phenobarbital (ineffective), Keppra (ineffective), Tegretol (initially worked), Neurontin, Zonegran, Zarontin, Depakote (rash), and Lamictal (rash). She had a long history of being on brand name medication but switched to generics due to economic issues with no side effects or breakthrough seizures. She has been on chronic treatment with combination Lamotrigine 100mg  BID, Oxcarbazepine 600mg  BID, and Topiramate 50mg  3 tabs BID (150mg  BID) without side effects. We discussed note on her chart of last seizure being in 2019, and clarified that it was not a seizure, she had a viral illness at that time and Dr. 2009' note in 12/2017 indicated "history most compatible with syncope related to viral illness and neuropathy making her unstable."  She lives with her husband and is his primary caregiver causing significant amount of stress. She denies any staring/unresponsive episodes, gaps in time, olfactory/gustatory hallucinations, deja vu, rising epigastric sensation, focal numbness/tingling/weakness, myoclonic jerks. She has a history of migraines and has had chronic  daily headaches for at least a year, taking 2 Tylenol every night for the past year. Pain is diffuse, there is no associated nausea/vomiting. When pain is severe, she has sensitivity to lights and sounds. She has a headache now. She has occasional dizziness. She states her back always hurts. No bowel/bladder dysfunction. She brings  notes from Dr. 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 Memory is "just like any 56 year old." She denies missing medications regularly, denies getting lost driving. She states there is a lot going on at home, she does not sleep, if she gets 3 hours of sleep she is good. Mood is "stressed." She works part-time as an Government social research officer.   Epilepsy Risk Factors:  Her daughter had seizures at age 75 that she outgrew, her 76 year old granddaughter has epilepsy. She recalls a pneumonia with a fever of 107 at age 7 but does not recall febrile convulsions. She had a normal birth and early development.  There is no history of CNS infections such as meningitis/encephalitis, significant traumatic brain injury, neurosurgical procedures  Prior ASMs: Dilantin (ineffective), Phenobarbital (ineffective), Keppra (ineffective), Tegretol (initially worked), Neurontin, Zonegran, Zarontin, Depakote (rash), and Lamictal (rash) Prior headache preventative medications: Depakote, Zonisamide, Neurontin   Diagnostic Data: EEGs:EMU evaluation in 2007 was abnormal with generalized spike and wave discharges, at which time one GTCS was captured during the admission.  JIR:CVELF is a brain MRI with and without contrast done 06/2019 which was normal.    PAST MEDICAL HISTORY: Past Medical History:  Diagnosis Date   Glaucoma    Hx of migraines    Macular degeneration    Pulmonary embolism (HCC)    Seizures (HCC)     MEDICATIONS: Current Outpatient Medications on File Prior to Visit  Medication Sig Dispense Refill   DULoxetine (CYMBALTA) 30 MG capsule Take 1 capsule every night (Take with 60mg  capsule for total of 90mg  at bedtime) 30 capsule 5   DULoxetine (CYMBALTA) 60 MG capsule Take 1 capsule every night (take with 30mg  capsule for total of 90mg  at bedtime) 30 capsule 5   lamoTRIgine (LAMICTAL) 100 MG tablet  Take 1 tablet (100 mg total) by mouth 2 (two) times daily. 180 tablet 3   oxcarbazepine (TRILEPTAL) 600 MG tablet Take 1 tablet (600 mg total) by mouth 2 (two) times daily. 180 tablet 3   topiramate (TOPAMAX) 50 MG tablet Take 3 tablets (150 mg total) by mouth 2 (two) times daily. 540 tablet 3   aspirin 81 MG chewable tablet Chew 81 mg by mouth daily. (Patient not taking: Reported on 04/13/2022)     No current facility-administered medications on file prior to visit.    ALLERGIES: Allergies  Allergen Reactions   Brinzolamide-Brimonidine Anaphylaxis    itching itching itching itching    Neomycin Anaphylaxis and Swelling   Valproic Acid Hives, Other (See Comments) and Rash    insomnia insomnia insomnia insomnia insomnia insomnia insomnia insomnia insomnia insomnia insomnia insomnia    Cefaclor Rash    Other reaction(s): Unknown   Hydrocodone-Acetaminophen Rash   Latex Rash   Phenobarbital Other (See Comments)    agitation agitation agitation agitation agitation agitation agitation agitation     FAMILY HISTORY: Family History  Problem Relation Age of Onset   Breast cancer Mother    Macular degeneration Mother    Stroke Father    Dementia Father    Diabetes Father  Glaucoma Father     SOCIAL HISTORY: Social History   Socioeconomic History   Marital status: Married    Spouse name: Not on file   Number of children: Not on file   Years of education: Not on file   Highest education level: Not on file  Occupational History   Not on file  Tobacco Use   Smoking status: Never   Smokeless tobacco: Never  Substance and Sexual Activity   Alcohol use: Not Currently   Drug use: Never   Sexual activity: Not on file  Other Topics Concern   Not on file  Social History Narrative   Right Handed    Lives in a two story home but resides on the first floor    Drinks no caffeine    Social Determinants of Health   Financial Resource Strain: Not on file   Food Insecurity: Not on file  Transportation Needs: Not on file  Physical Activity: Not on file  Stress: Not on file  Social Connections: Not on file  Intimate Partner Violence: Not on file     PHYSICAL EXAM: Vitals:   04/13/22 0923  BP: 112/74  Pulse: 70  SpO2: 100%   General: No acute distress Head:  Normocephalic/atraumatic Skin/Extremities: No rash, no edema Neurological Exam: alert and awake. No aphasia or dysarthria. Fund of knowledge is appropriate.  Attention and concentration are normal.   Cranial nerves: Pupils equal, round. Extraocular movements intact with no nystagmus. Visual fields full.  No facial asymmetry.  Motor: Bulk and tone normal, muscle strength 5/5 throughout with no pronator drift.   Finger to nose testing intact.  Gait narrow-based and steady, able to tandem walk adequately.  Romberg negative.   IMPRESSION: This is a 56 yo RH woman with a history of pulmonary embolism, migraines, and generalized epilepsy. Mood is visibly better today. She denies any seizures since 2008 on Lamotrigine 100mg  BID, oxcarbazepine 600mg  BID, and Topiramate 150mg  BID. Sleep and mood are better with Cymbalta 90mg  qhs, it appears headaches are better as well. She will let us know when she needs refills. She is aware of [place] driving laws to stop driving after a seizure until 6 months seizure-free. Follow-up in 6 months, call for any changes.    Thank you for allowing me to participate in her care.  Please do not hesitate to call for any questions or concerns.    Ellouise Newer, M.D.   CC: [place]

## 2022-04-27 ENCOUNTER — Telehealth: Payer: Self-pay | Admitting: Neurology

## 2022-04-27 NOTE — Telephone Encounter (Signed)
Pt was in the office today for her husband's appt. She wanted to let Dr. Karel Jarvis know that she has a bad headache today. It's the worst one in a long time.

## 2022-04-27 NOTE — Telephone Encounter (Signed)
Patient is going to try Advil and call back with update.

## 2022-04-27 NOTE — Telephone Encounter (Signed)
How frequent or the headaches (on average, how many days a week/month are they occurring)?  First one in a long time  How long do the headaches last?  Mild but its really bad and its been a number of years  Verify what preventative medication and dose you are taking (e.g. topiramate, propranolol, amitriptyline, Emgality, etc)  Topiramatenio for seizures  Verify which rescue medication you are taking (triptan, Advil, Excedrin, Aleve, Ubrelvy, etc)  no How often are you taking pain relievers/analgesics/rescue mediction?  Hadn't had any headaches in a long time but feeling really bad today

## 2022-04-27 NOTE — Telephone Encounter (Signed)
She may try OTC ibuprofen 600mg , if she has not tried anything at home yet.  Otherwise, we can offer her toradol 30mg  injection.

## 2022-05-28 DIAGNOSIS — Z01411 Encounter for gynecological examination (general) (routine) with abnormal findings: Secondary | ICD-10-CM | POA: Diagnosis not present

## 2022-05-28 DIAGNOSIS — R69 Illness, unspecified: Secondary | ICD-10-CM | POA: Diagnosis not present

## 2022-05-28 DIAGNOSIS — Z01419 Encounter for gynecological examination (general) (routine) without abnormal findings: Secondary | ICD-10-CM | POA: Diagnosis not present

## 2022-05-28 DIAGNOSIS — Z124 Encounter for screening for malignant neoplasm of cervix: Secondary | ICD-10-CM | POA: Diagnosis not present

## 2022-05-28 DIAGNOSIS — L940 Localized scleroderma [morphea]: Secondary | ICD-10-CM | POA: Diagnosis not present

## 2022-05-28 DIAGNOSIS — R197 Diarrhea, unspecified: Secondary | ICD-10-CM | POA: Diagnosis not present

## 2022-05-28 DIAGNOSIS — N950 Postmenopausal bleeding: Secondary | ICD-10-CM | POA: Diagnosis not present

## 2022-06-07 ENCOUNTER — Other Ambulatory Visit: Payer: Self-pay | Admitting: Neurology

## 2022-06-08 DIAGNOSIS — N950 Postmenopausal bleeding: Secondary | ICD-10-CM | POA: Diagnosis not present

## 2022-06-09 ENCOUNTER — Encounter: Payer: Self-pay | Admitting: Nurse Practitioner

## 2022-06-17 DIAGNOSIS — J40 Bronchitis, not specified as acute or chronic: Secondary | ICD-10-CM | POA: Diagnosis not present

## 2022-06-17 DIAGNOSIS — R197 Diarrhea, unspecified: Secondary | ICD-10-CM | POA: Diagnosis not present

## 2022-06-17 DIAGNOSIS — R634 Abnormal weight loss: Secondary | ICD-10-CM | POA: Diagnosis not present

## 2022-06-17 DIAGNOSIS — J0100 Acute maxillary sinusitis, unspecified: Secondary | ICD-10-CM | POA: Diagnosis not present

## 2022-06-18 DIAGNOSIS — Z1151 Encounter for screening for human papillomavirus (HPV): Secondary | ICD-10-CM | POA: Diagnosis not present

## 2022-06-18 DIAGNOSIS — R69 Illness, unspecified: Secondary | ICD-10-CM | POA: Diagnosis not present

## 2022-06-18 DIAGNOSIS — Z124 Encounter for screening for malignant neoplasm of cervix: Secondary | ICD-10-CM | POA: Diagnosis not present

## 2022-07-04 ENCOUNTER — Other Ambulatory Visit: Payer: Self-pay | Admitting: Neurology

## 2022-07-08 NOTE — Progress Notes (Unsigned)
07/08/2022 Laurie Silva [id number] Mar 04, 1966   CHIEF COMPLAINT: Diarrhea  HISTORY OF PRESENT ILLNESS: Laurie Silva is a 56 year old female with a past medical history of migraine headaches, macular degeneration, epilepsy, PE She presents her office today for further evaluation regarding diarrhea which started 01/2022.  Past Medical History:  Diagnosis Date   Glaucoma    Hx of migraines    Macular degeneration    Pulmonary embolism (HCC)    Seizures ([place])    Past Surgical History:  Procedure Laterality Date   PARTIAL HYSTERECTOMY     TUBAL LIGATION     Social History:  Family History: Mother with history of breast cancer and macular degeneration.  Father with diabetes, stroke and dementia.   reports that she has never smoked. She has never used smokeless tobacco. She reports that she does not currently use alcohol. She reports that she does not use drugs.  Allergies  Allergen Reactions   Brinzolamide-Brimonidine Anaphylaxis    itching itching itching itching    Neomycin Anaphylaxis and Swelling   Valproic Acid Hives, Other (See Comments) and Rash    insomnia insomnia insomnia insomnia insomnia insomnia insomnia insomnia insomnia insomnia insomnia insomnia    Cefaclor Rash    Other reaction(s): Unknown   Hydrocodone-Acetaminophen Rash   Latex Rash   Phenobarbital Other (See Comments)    agitation agitation agitation agitation agitation agitation agitation agitation       Outpatient Encounter Medications as of 07/09/2022  Medication Sig   aspirin 81 MG chewable tablet Chew 81 mg by mouth daily. (Patient not taking: Reported on 04/13/2022)   DULoxetine (CYMBALTA) 30 MG capsule Take 1 capsule every night (Take with 60mg  capsule for total of 90mg  at bedtime)   DULoxetine (CYMBALTA) 60 MG capsule Take 1 capsule every night (take with 30mg  capsule for total of 90mg  at bedtime)   lamoTRIgine (LAMICTAL) 100 MG tablet TAKE 1 TABLET BY  MOUTH TWICE A DAY   oxcarbazepine (TRILEPTAL) 600 MG tablet TAKE 1 TABLET BY MOUTH TWICE A DAY   topiramate (TOPAMAX) 50 MG tablet TAKE 3 TABLETS BY MOUTH TWICE A DAY   No facility-administered encounter medications on file as of 07/09/2022.    REVIEW OF SYSTEMS:  Gen: Denies fever, sweats or chills. No weight loss.  CV: Denies chest pain, palpitations or edema. Resp: Denies cough, shortness of breath of hemoptysis.  GI: Denies heartburn, dysphagia, stomach or lower abdominal pain. No diarrhea or constipation.  GU : Denies urinary burning, blood in urine, increased urinary frequency or incontinence. MS: Denies joint pain, muscles aches or weakness. Derm: Denies rash, itchiness, skin lesions or unhealing ulcers. Psych: Denies depression, anxiety, memory loss, suicidal ideation and confusion. Heme: Denies bruising, easy bleeding. Neuro:  Denies headaches, dizziness or paresthesias. Endo:  Denies any problems with DM, thyroid or adrenal function.  PHYSICAL EXAM: There were no vitals taken for this visit. General: Well developed ... in no acute distress. Head: Normocephalic and atraumatic. Eyes:  Sclerae non-icteric, conjunctive pink. Ears: Normal auditory acuity. Mouth: Dentition intact. No ulcers or lesions.  Neck: Supple, no lymphadenopathy or thyromegaly.  Lungs: Clear bilaterally to auscultation without wheezes, crackles or rhonchi. Heart: Regular rate and rhythm. No murmur, rub or gallop appreciated.  Abdomen: Soft, nontender, non distended. No masses. No hepatosplenomegaly. Normoactive bowel sounds x 4 quadrants.  Rectal:  Musculoskeletal: Symmetrical with no gross deformities. Skin: Warm and dry. No rash or lesions on visible extremities. Extremities: No edema. Neurological: Alert  oriented x 4, no focal deficits.  Psychological:  Alert and cooperative. Normal mood and affect.  ASSESSMENT AND PLAN:    CC:  [facility], [facility]*

## 2022-07-09 ENCOUNTER — Encounter: Payer: Self-pay | Admitting: Nurse Practitioner

## 2022-07-09 ENCOUNTER — Other Ambulatory Visit (INDEPENDENT_AMBULATORY_CARE_PROVIDER_SITE_OTHER): Payer: 59

## 2022-07-09 ENCOUNTER — Ambulatory Visit: Payer: 59 | Admitting: Nurse Practitioner

## 2022-07-09 VITALS — BP 118/82 | HR 46 | Ht 63.0 in | Wt 148.0 lb

## 2022-07-09 DIAGNOSIS — R197 Diarrhea, unspecified: Secondary | ICD-10-CM | POA: Diagnosis not present

## 2022-07-09 DIAGNOSIS — A09 Infectious gastroenteritis and colitis, unspecified: Secondary | ICD-10-CM | POA: Diagnosis not present

## 2022-07-09 LAB — COMPREHENSIVE METABOLIC PANEL
ALT: 8 U/L (ref 0–35)
AST: 15 U/L (ref 0–37)
Albumin: 4.4 g/dL (ref 3.5–5.2)
Alkaline Phosphatase: 78 U/L (ref 39–117)
BUN: 13 mg/dL (ref 6–23)
CO2: 28 mEq/L (ref 19–32)
Calcium: 9.5 mg/dL (ref 8.4–10.5)
Chloride: 103 mEq/L (ref 96–112)
Creatinine, Ser: 0.75 mg/dL (ref 0.40–1.20)
GFR: 88.25 mL/min (ref >60.00)
Glucose, Bld: 84 mg/dL (ref 70–99)
Potassium: 4 mEq/L (ref 3.5–5.1)
Sodium: 138 mEq/L (ref 135–145)
Total Bilirubin: 0.5 mg/dL (ref 0.2–1.2)
Total Protein: 7.1 g/dL (ref 6.0–8.3)

## 2022-07-09 LAB — CBC WITH DIFFERENTIAL/PLATELET
Basophils Absolute: 0.1 10*3/uL (ref 0.0–0.1)
Basophils Relative: 1.4 % (ref 0.0–3.0)
Eosinophils Absolute: 0.4 10*3/uL (ref 0.0–0.7)
Eosinophils Relative: 7.1 % — ABNORMAL HIGH (ref 0.0–5.0)
HCT: 37.9 % (ref 36.0–46.0)
Hemoglobin: 12.9 g/dL (ref 12.0–15.0)
Lymphocytes Relative: 28.7 % (ref 12.0–46.0)
Lymphs Abs: 1.6 10*3/uL (ref 0.7–4.0)
MCHC: 33.9 g/dL (ref 30.0–36.0)
MCV: 91.4 fl (ref 78.0–100.0)
Monocytes Absolute: 0.5 10*3/uL (ref 0.1–1.0)
Monocytes Relative: 8.8 % (ref 3.0–12.0)
Neutro Abs: 3 10*3/uL (ref 1.4–7.7)
Neutrophils Relative %: 54 % (ref 43.0–77.0)
Platelets: 263 10*3/uL (ref 150.0–400.0)
RBC: 4.15 Mil/uL (ref 3.87–5.11)
RDW: 12.9 % (ref 11.5–15.5)
WBC: 5.6 10*3/uL (ref 4.0–10.5)

## 2022-07-09 LAB — C-REACTIVE PROTEIN: CRP: <1 mg/dL (ref 0.5–20.0)

## 2022-07-09 LAB — TSH: TSH: 2.64 u[IU]/mL (ref 0.35–5.50)

## 2022-07-09 NOTE — Patient Instructions (Addendum)
You have been scheduled for a colonoscopy. Please follow written instructions given to you at your visit today.  Please pick up your prep supplies at the pharmacy within the next 1-3 days. If you use inhalers (even only as needed), please bring them with you on the day of your procedure.   Your provider has requested that you go to the basement level for lab work before leaving today. Press "B" on the elevator. The lab is located at the first door on the left as you exit the elevator.   Follow up with your primary care doctor regarding your dry cough.  Due to recent changes in healthcare laws, you may see the results of your imaging and laboratory studies on MyChart before your provider has had a chance to review them.  We understand that in some cases there may be results that are confusing or concerning to you. Not all laboratory results come back in the same time frame and the provider may be waiting for multiple results in order to interpret others.  Please give [place] 48 hours in order for your provider to thoroughly review all the results before contacting the office for clarification of your results.    It was a pleasure to see you today!  Thank you for trusting me with your gastrointestinal care!

## 2022-07-09 NOTE — Addendum Note (Signed)
Addended by: Annabell Howells on: 07/09/2022 01:42 PM   Modules accepted: Orders

## 2022-07-09 NOTE — Progress Notes (Signed)
Agree with assessment and plan as outlined.  

## 2022-07-10 LAB — TISSUE TRANSGLUTAMINASE ABS,IGG,IGA
(tTG) Ab, IgA: <1 U/mL
(tTG) Ab, IgG: <1 U/mL

## 2022-07-10 LAB — IGA: Immunoglobulin A: 148 mg/dL (ref 47–310)

## 2022-07-13 ENCOUNTER — Telehealth: Payer: Self-pay

## 2022-07-13 NOTE — Telephone Encounter (Signed)
   Procedure Canceled

## 2022-07-14 ENCOUNTER — Encounter: Payer: 59 | Admitting: Gastroenterology

## 2022-08-03 DIAGNOSIS — J069 Acute upper respiratory infection, unspecified: Secondary | ICD-10-CM | POA: Diagnosis not present

## 2022-08-03 DIAGNOSIS — R059 Cough, unspecified: Secondary | ICD-10-CM | POA: Diagnosis not present

## 2022-08-20 ENCOUNTER — Other Ambulatory Visit: Payer: Self-pay

## 2022-08-20 ENCOUNTER — Telehealth: Payer: Self-pay | Admitting: Neurology

## 2022-08-20 DIAGNOSIS — G43709 Chronic migraine without aura, not intractable, without status migrainosus: Secondary | ICD-10-CM

## 2022-08-20 DIAGNOSIS — H53462 Homonymous bilateral field defects, left side: Secondary | ICD-10-CM

## 2022-08-20 DIAGNOSIS — G40309 Generalized idiopathic epilepsy and epileptic syndromes, not intractable, without status epilepticus: Secondary | ICD-10-CM

## 2022-08-20 DIAGNOSIS — R519 Headache, unspecified: Secondary | ICD-10-CM

## 2022-08-20 DIAGNOSIS — F4321 Adjustment disorder with depressed mood: Secondary | ICD-10-CM

## 2022-08-20 MED ORDER — TOPIRAMATE 50 MG PO TABS: 150.0000 mg | ORAL_TABLET | Freq: Two times a day (BID) | ORAL | 0 refills | Status: DC

## 2022-08-20 MED ORDER — OXCARBAZEPINE 600 MG PO TABS: 600.0000 mg | ORAL_TABLET | Freq: Two times a day (BID) | ORAL | 0 refills | Status: DC

## 2022-08-20 MED ORDER — LAMOTRIGINE 100 MG PO TABS: 100.0000 mg | ORAL_TABLET | Freq: Two times a day (BID) | ORAL | 0 refills | Status: DC

## 2022-08-20 NOTE — Telephone Encounter (Signed)
Sent in prescription and called patient

## 2022-08-20 NOTE — Telephone Encounter (Signed)
Pt is changing pharmacies  1. Which medications need refilled? (List name and dosage, if known) oxcarbazepine, lamotrigine, and topiramate  2. Which pharmacy/location is medication to be sent to? (include street and city if local pharmacy) [facility] [address] [place]  Pt states she is out of her topiramate and does not have enough to take tonight.

## 2022-10-23 ENCOUNTER — Ambulatory Visit: Payer: 59 | Admitting: Neurology

## 2022-11-04 ENCOUNTER — Ambulatory Visit: Payer: 59 | Admitting: Neurology

## 2022-11-18 ENCOUNTER — Other Ambulatory Visit: Payer: Self-pay | Admitting: Neurology

## 2022-11-18 DIAGNOSIS — G40309 Generalized idiopathic epilepsy and epileptic syndromes, not intractable, without status epilepticus: Secondary | ICD-10-CM

## 2022-11-18 DIAGNOSIS — G43709 Chronic migraine without aura, not intractable, without status migrainosus: Secondary | ICD-10-CM

## 2022-11-18 DIAGNOSIS — F4321 Adjustment disorder with depressed mood: Secondary | ICD-10-CM

## 2022-11-18 DIAGNOSIS — H53462 Homonymous bilateral field defects, left side: Secondary | ICD-10-CM

## 2022-11-18 DIAGNOSIS — R519 Headache, unspecified: Secondary | ICD-10-CM

## 2022-11-18 NOTE — Telephone Encounter (Signed)
Sent to Dr Delice Lesch to send in

## 2022-11-18 NOTE — Telephone Encounter (Signed)
Pt called stating that her Rx for Trileptal was not sent to her pharmacy. She is out of medication and would like to get it today if possible.

## 2022-11-18 NOTE — Telephone Encounter (Signed)
1. Which medications need refilled? (List name and dosage, if known) oxycarbazepine, topiramate, and lamotrigine. She is about out of oxycarbazepine.  2. Which pharmacy/location is medication to be sent to? (include street and city if Management consultant) [place]

## 2022-11-19 ENCOUNTER — Other Ambulatory Visit: Payer: Self-pay

## 2022-11-19 DIAGNOSIS — G43709 Chronic migraine without aura, not intractable, without status migrainosus: Secondary | ICD-10-CM

## 2022-11-19 DIAGNOSIS — F4321 Adjustment disorder with depressed mood: Secondary | ICD-10-CM

## 2022-11-19 DIAGNOSIS — G40309 Generalized idiopathic epilepsy and epileptic syndromes, not intractable, without status epilepticus: Secondary | ICD-10-CM

## 2022-11-19 DIAGNOSIS — R519 Headache, unspecified: Secondary | ICD-10-CM

## 2022-11-19 DIAGNOSIS — H53462 Homonymous bilateral field defects, left side: Secondary | ICD-10-CM

## 2022-11-19 MED ORDER — OXCARBAZEPINE 600 MG PO TABS: 600.0000 mg | ORAL_TABLET | Freq: Two times a day (BID) | ORAL | 0 refills | Status: DC

## 2022-11-19 NOTE — Telephone Encounter (Signed)
Patient is calling in upset that she is completely out of the medication,oxcarbazepine (TRILEPTAL) 600 MG tablet  and has been trying to get this refilled for 3 days.

## 2022-11-19 NOTE — Telephone Encounter (Signed)
Pt called informed refill sent to the pharmacy

## 2023-01-29 ENCOUNTER — Ambulatory Visit: Payer: 59 | Admitting: Family Medicine

## 2023-02-13 ENCOUNTER — Other Ambulatory Visit: Payer: Self-pay | Admitting: Neurology

## 2023-02-13 DIAGNOSIS — G43709 Chronic migraine without aura, not intractable, without status migrainosus: Secondary | ICD-10-CM

## 2023-02-13 DIAGNOSIS — G40309 Generalized idiopathic epilepsy and epileptic syndromes, not intractable, without status epilepticus: Secondary | ICD-10-CM

## 2023-02-13 DIAGNOSIS — H53462 Homonymous bilateral field defects, left side: Secondary | ICD-10-CM

## 2023-02-13 DIAGNOSIS — F4321 Adjustment disorder with depressed mood: Secondary | ICD-10-CM

## 2023-02-13 DIAGNOSIS — R519 Headache, unspecified: Secondary | ICD-10-CM

## 2023-02-16 ENCOUNTER — Telehealth: Payer: Self-pay | Admitting: Neurology

## 2023-02-16 DIAGNOSIS — H53462 Homonymous bilateral field defects, left side: Secondary | ICD-10-CM

## 2023-02-16 DIAGNOSIS — G43709 Chronic migraine without aura, not intractable, without status migrainosus: Secondary | ICD-10-CM

## 2023-02-16 DIAGNOSIS — G40309 Generalized idiopathic epilepsy and epileptic syndromes, not intractable, without status epilepticus: Secondary | ICD-10-CM

## 2023-02-16 DIAGNOSIS — R519 Headache, unspecified: Secondary | ICD-10-CM

## 2023-02-16 DIAGNOSIS — F4321 Adjustment disorder with depressed mood: Secondary | ICD-10-CM

## 2023-02-16 NOTE — Telephone Encounter (Signed)
Pt called back and let me know the medication she is almost out of is the oxcarbazepine. She will be out tomorrow. A few others she will be out of in a week.  Pt also wanted to speak with a nurse about a matter that she did not want to discuss with me.

## 2023-02-16 NOTE — Telephone Encounter (Signed)
1. Which medications need refilled? (List name and dosage, if known) All of her prescriptions  2. Which pharmacy/location is medication to be sent to? 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 She cannot use [facility] anymore. Needs all her prescriptions moved. Stated she will be out of one of them tomorrow.

## 2023-02-17 DIAGNOSIS — H25813 Combined forms of age-related cataract, bilateral: Secondary | ICD-10-CM | POA: Diagnosis not present

## 2023-02-17 DIAGNOSIS — H40023 Open angle with borderline findings, high risk, bilateral: Secondary | ICD-10-CM | POA: Diagnosis not present

## 2023-02-17 MED ORDER — DULOXETINE HCL 60 MG PO CPEP: ORAL_CAPSULE | ORAL | 2 refills | Status: DC

## 2023-02-17 MED ORDER — OXCARBAZEPINE 600 MG PO TABS
600.0000 mg | ORAL_TABLET | Freq: Two times a day (BID) | ORAL | 0 refills | Status: DC
Start: 2023-02-17 — End: 2023-03-16

## 2023-02-17 MED ORDER — DULOXETINE HCL 30 MG PO CPEP: ORAL_CAPSULE | ORAL | 2 refills | Status: DC

## 2023-02-17 MED ORDER — LAMOTRIGINE 100 MG PO TABS
100.0000 mg | ORAL_TABLET | Freq: Two times a day (BID) | ORAL | 0 refills | Status: DC
Start: 2023-02-17 — End: 2023-03-16

## 2023-02-17 MED ORDER — TOPIRAMATE 50 MG PO TABS
150.0000 mg | ORAL_TABLET | Freq: Two times a day (BID) | ORAL | 0 refills | Status: DC
Start: 2023-02-17 — End: 2023-03-16

## 2023-02-17 NOTE — Telephone Encounter (Signed)
Pt called she is at the eye Dr she will call back, refill of all medication sent in

## 2023-02-17 NOTE — Telephone Encounter (Signed)
F/u   Returning call back to the nurse  

## 2023-02-17 NOTE — Telephone Encounter (Signed)
Will wait to talk to Dr Karel Jarvis at her appointment

## 2023-02-19 ENCOUNTER — Other Ambulatory Visit: Payer: Self-pay | Admitting: Neurology

## 2023-03-08 ENCOUNTER — Encounter: Payer: Self-pay | Admitting: Family Medicine

## 2023-03-08 ENCOUNTER — Ambulatory Visit: Payer: 59 | Admitting: Family Medicine

## 2023-03-08 VITALS — BP 104/58 | HR 62 | Resp 18 | Ht 63.0 in | Wt 152.0 lb

## 2023-03-08 DIAGNOSIS — Z0000 Encounter for general adult medical examination without abnormal findings: Secondary | ICD-10-CM | POA: Diagnosis not present

## 2023-03-08 DIAGNOSIS — M722 Plantar fascial fibromatosis: Secondary | ICD-10-CM | POA: Insufficient documentation

## 2023-03-08 DIAGNOSIS — F439 Reaction to severe stress, unspecified: Secondary | ICD-10-CM | POA: Diagnosis not present

## 2023-03-08 NOTE — Progress Notes (Signed)
New patient visit   Patient: Laurie Silva   DOB: [birth year]   57 y.o. Female  MRN: [id number] Visit Date: 03/08/2023  Today's healthcare provider: Charlton Amor, DO   Chief Complaint  Patient presents with   Establish Care    SUBJECTIVE    Chief Complaint  Patient presents with   Establish Care   HPI  Pt presents to establish care. Recently moved here in January from [place].  Pt has R foot pain - has had pain for years and is getting worse - she was scheduled for a podiatry appointment but had to cancel due to expenses   Has financial stress due to paying for bills. Recently lost her job and is having trouble finding another job.   Says her husband is getting worse with memory. She says he has asked her 4 times where they are going this morning. She is currently taking care of her husband and now her mom. This adds a lot of stress to her life.   She has a pmh of epilepsy. She follows with her neurologist in July.  Review of Systems  Constitutional:  Negative for activity change, fatigue and fever.  Respiratory:  Negative for cough and shortness of breath.   Cardiovascular:  Negative for chest pain.  Gastrointestinal:  Negative for abdominal pain.  Genitourinary:  Negative for difficulty urinating.       Current Meds  Medication Sig   lamoTRIgine (LAMICTAL) 100 MG tablet Take 1 tablet (100 mg total) by mouth 2 (two) times daily.   oxcarbazepine (TRILEPTAL) 600 MG tablet Take 1 tablet (600 mg total) by mouth 2 (two) times daily.   topiramate (TOPAMAX) 50 MG tablet Take 3 tablets (150 mg total) by mouth 2 (two) times daily.    OBJECTIVE    BP (!) 104/58 (BP Location: Right Arm, Patient Position: Sitting, Cuff Size: Normal)   Pulse 62   Resp 18   Ht 5\' 3"  (1.6 m)   Wt 152 lb (68.9 kg)   SpO2 100%   BMI 26.93 kg/m   Physical Exam Vitals and nursing note reviewed.  Constitutional:      General: She is not in acute distress.    Appearance: Normal  appearance.  HENT:     Head: Normocephalic and atraumatic.     Right Ear: External ear normal.     Left Ear: External ear normal.     Nose: Nose normal.  Eyes:     Conjunctiva/sclera: Conjunctivae normal.  Cardiovascular:     Rate and Rhythm: Normal rate and regular rhythm.  Pulmonary:     Effort: Pulmonary effort is normal.     Breath sounds: Normal breath sounds.  Musculoskeletal:     Comments: Tenderness to palpation of right plantar fascia   Neurological:     General: No focal deficit present.     Mental Status: She is alert and oriented to person, place, and time.  Psychiatric:        Mood and Affect: Mood normal.        Behavior: Behavior normal.        Thought Content: Thought content normal.        Judgment: Judgment normal.       ASSESSMENT & PLAN    Problem List Items Addressed This Visit       Musculoskeletal and Integument   Plantar fasciitis of right foot - Primary    - tenderness to palpation of R foot/plantar fascia  -  discussed and provided patient with exercises. Recommended ibuprofen 400mg  Q8hrs prn pain.recommended iced water bottle technique - if no better, can consider formal PT         Other   Stress    - pt under a lot of stress with husband suffering from dementia. Discussed trying to get her out of the house multiple times a week to take a break      Other Visit Diagnoses     Encounter for medical examination to establish care           Return if symptoms worsen or fail to improve.      No orders of the defined types were placed in this encounter.   No orders of the defined types were placed in this encounter.    Charlton Amor, DO  [facility] at [facility] [phone] (phone) [phone] (fax)  [facility]

## 2023-03-08 NOTE — Assessment & Plan Note (Addendum)
-   tenderness to palpation of R foot/plantar fascia  - discussed and provided patient with exercises. Recommended ibuprofen 400mg  Q8hrs prn pain.recommended iced water bottle technique - if no better, can consider formal PT

## 2023-03-08 NOTE — Assessment & Plan Note (Signed)
-   pt under a lot of stress with husband suffering from dementia. Discussed trying to get her out of the house multiple times a week to take a break

## 2023-03-08 NOTE — Patient Instructions (Signed)
Try freezing a water bottle and rubbing it over the foot

## 2023-03-12 ENCOUNTER — Ambulatory Visit (INDEPENDENT_AMBULATORY_CARE_PROVIDER_SITE_OTHER): Payer: 59 | Admitting: Podiatry

## 2023-03-12 DIAGNOSIS — Z91199 Patient's noncompliance with other medical treatment and regimen due to unspecified reason: Secondary | ICD-10-CM

## 2023-03-12 NOTE — Progress Notes (Signed)
No show

## 2023-03-16 ENCOUNTER — Encounter: Payer: Self-pay | Admitting: Neurology

## 2023-03-16 ENCOUNTER — Ambulatory Visit: Payer: 59 | Admitting: Neurology

## 2023-03-16 VITALS — BP 121/64 | HR 63 | Ht 62.0 in | Wt 149.4 lb

## 2023-03-16 DIAGNOSIS — G43709 Chronic migraine without aura, not intractable, without status migrainosus: Secondary | ICD-10-CM | POA: Diagnosis not present

## 2023-03-16 DIAGNOSIS — R519 Headache, unspecified: Secondary | ICD-10-CM

## 2023-03-16 DIAGNOSIS — G40309 Generalized idiopathic epilepsy and epileptic syndromes, not intractable, without status epilepticus: Secondary | ICD-10-CM | POA: Diagnosis not present

## 2023-03-16 MED ORDER — DULOXETINE HCL 30 MG PO CPEP: 30.0000 mg | ORAL_CAPSULE | Freq: Every day | ORAL | 3 refills | Status: DC

## 2023-03-16 MED ORDER — LAMOTRIGINE 100 MG PO TABS: 100.0000 mg | ORAL_TABLET | Freq: Two times a day (BID) | ORAL | 3 refills | Status: DC

## 2023-03-16 MED ORDER — TOPIRAMATE 50 MG PO TABS: 150.0000 mg | ORAL_TABLET | Freq: Two times a day (BID) | ORAL | 3 refills | Status: DC

## 2023-03-16 MED ORDER — OXCARBAZEPINE 600 MG PO TABS: 600.0000 mg | ORAL_TABLET | Freq: Two times a day (BID) | ORAL | 3 refills | Status: DC

## 2023-03-16 NOTE — Progress Notes (Signed)
NEUROLOGY FOLLOW UP OFFICE NOTE  ARLENY RAMP [id number] 05-17-1966  HISTORY OF PRESENT ILLNESS: I had the pleasure of seeing Shaza Wisnewski in follow-up in the neurology clinic on 03/16/2023.  The patient was last seen a year ago for generalized epilepsy and chronic migraine. She is alone in the office today. Records and images were personally reviewed where available. She is on Lamotrigine 100mg  BID, Oxcarbazepine 600mg  BID, and Topiramate 150mg  BID with no seizures since 2008. There was an incident at work which she does not think was a seizure, she only recalls her vision was briefly blurred. She was at work on Jan 13, 2023 and reports she was at the self-checkout and her boss called her another name jokingly. They were laughing about it, then after her break she was joking with the pharmacist who "took it all the wrong way." Pharmacist told her boss that patient had grabbed her arm, hitting her, and it was black and blue, and that she felt unsafe. She was called to the office and told they do not tolerate that behavior and fired her. She endorses a significant amount of caregiver stress. She has been on Cymbalta. She reports the stress of taking care of her husband who screams at her and needs 24/7 care is so much, she is "at a breaking point." She has been having more headaches, the past 2 days have been bad, she has been "eating Advil." She does not sleep at night. She has been laying in bed the past 2 days due to headaches that she says are stress-related. 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    History on Initial Assessment 07/01/2021: This is a 57 year old right-handed woman with a history of pulmonary embolism, migraines, and generalized epilepsy, presenting to establish care. Records from her epileptologist Dr. Tera Helper were reviewed. She was previously followed by Dr. Jac Canavan and Dr. Layla Barter. Seizures started at age 23. She had staring spells as a  child. She denies any myoclonic jerks. No warning prior to her convulsions, she states her last seizure was in 2008. EMU evaluation in 2007 was abnormal with generalized spike and wave discharges, at which time one GTCS was captured during the admission. There is a brain MRI with and without contrast done 06/2019 which was normal. She has tried multiple ASMs in the past, including Dilantin (ineffective), Phenobarbital (ineffective), Keppra (ineffective), Tegretol (initially worked), Neurontin, Zonegran, Zarontin, Depakote (rash), and Lamictal (rash). She had a long history of being on brand name medication but switched to generics due to economic issues with no side effects or breakthrough seizures. She has been on chronic treatment with combination Lamotrigine 100mg  BID, Oxcarbazepine 600mg  BID, and Topiramate 50mg  3 tabs BID (150mg  BID) without side effects. We discussed note on her chart of last seizure being in 2019, and clarified that it was not a seizure, she had a viral illness at that time and Dr. Tera Helper' note in 12/2017 indicated "history most compatible with syncope related to viral illness and neuropathy making her unstable."  She lives with her husband and is his primary caregiver causing significant amount of stress. She denies any staring/unresponsive episodes, gaps in time, olfactory/gustatory hallucinations, deja vu, rising epigastric sensation, focal numbness/tingling/weakness, myoclonic jerks. She has a history of migraines and has had chronic daily headaches for at least a year, taking 2 Tylenol every night for the past year. Pain is diffuse, there is no associated nausea/vomiting. When pain is severe,  she has sensitivity to lights and sounds. She has a headache now. She has occasional dizziness. She states her back always hurts. No bowel/bladder dysfunction. She brings notes from Dr. 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 Memory is "just like any 57 year old." She denies missing medications regularly, denies getting lost driving. She states there is a lot going on at home, she does not sleep, if she gets 3 hours of sleep she is good. Mood is "stressed." She works part-time as an Government social research officer.   Epilepsy Risk Factors:  Her daughter had seizures at age 58 that she outgrew, her 25 year old granddaughter has epilepsy. She recalls a pneumonia with a fever of 107 at age 57 but does not recall febrile convulsions. She had a normal birth and early development.  There is no history of CNS infections such as meningitis/encephalitis, significant traumatic brain injury, neurosurgical procedures  Prior ASMs: Dilantin (ineffective), Phenobarbital (ineffective), Keppra (ineffective), Tegretol (initially worked), Neurontin, Zonegran, Zarontin, Depakote (rash), and Lamictal (rash) Prior headache preventative medications: Depakote, Zonisamide, Neurontin, Cymbalta, nortriptyline   Diagnostic Data: EEGs:EMU evaluation in 2007 was abnormal with generalized spike and wave discharges, at which time one GTCS was captured during the admission.  ZOX:WRUEA is a brain MRI with and without contrast done 06/2019 which was normal.    PAST MEDICAL HISTORY: Past Medical History:  Diagnosis Date   Glaucoma    Hx of migraines    Macular degeneration    Pulmonary embolism (HCC)    Seizures (HCC)     MEDICATIONS: Current Outpatient Medications on File Prior to Visit  Medication Sig Dispense Refill   lamoTRIgine (LAMICTAL) 100 MG tablet Take 1 tablet (100 mg total) by mouth 2 (two) times daily. 180 tablet 0   oxcarbazepine (TRILEPTAL) 600 MG tablet Take 1 tablet (600 mg total) by mouth 2 (two) times daily. 180 tablet 0   topiramate (TOPAMAX) 50 MG tablet Take 3 tablets (150 mg total) by mouth 2 (two) times daily. 540 tablet 0   No current facility-administered medications on file prior to visit.     ALLERGIES: Allergies  Allergen Reactions   Brinzolamide-Brimonidine Anaphylaxis    itching itching itching itching    Neomycin Anaphylaxis and Swelling   Valproic Acid Hives, Other (See Comments) and Rash    insomnia insomnia insomnia insomnia insomnia insomnia insomnia insomnia insomnia insomnia insomnia insomnia    Cefaclor Rash    Other reaction(s): Unknown   Hydrocodone-Acetaminophen Rash   Latex Rash   Phenobarbital Other (See Comments)    agitation agitation agitation agitation agitation agitation agitation agitation     FAMILY HISTORY: Family History  Problem Relation Age of Onset   Breast cancer Mother    Macular degeneration Mother    Stroke Father    Dementia Father    Diabetes Father    Glaucoma Father     SOCIAL HISTORY: Social History   Socioeconomic History   Marital status: Married    Spouse name: Not on file   Number of children: 4   Years of education: Not on file   Highest education level: Not on file  Occupational History   Occupation: Conservation officer, nature  Tobacco Use   Smoking status: Never   Smokeless tobacco: Never  Vaping Use   Vaping Use: Never used  Substance and Sexual Activity   Alcohol use: Not Currently   Drug use: Never  Sexual activity: Not on file  Other Topics Concern   Not on file  Social History Narrative   Right Handed    Lives in a two story home but resides on the first floor    Drinks no caffeine    Social Determinants of Health   Financial Resource Strain: Not on file  Food Insecurity: Not on file  Transportation Needs: Not on file  Physical Activity: Not on file  Stress: Not on file  Social Connections: Not on file  Intimate Partner Violence: Not on file     PHYSICAL EXAM: Vitals:   03/16/23 1414  BP: 121/64  Pulse: 63  SpO2: 99%   General: No acute distress, tearful Head:  Normocephalic/atraumatic Skin/Extremities: No rash, no edema Neurological Exam: alert and awake. No aphasia or  dysarthria. Fund of knowledge is appropriate. Attention and concentration are normal.   Cranial nerves: Pupils equal, round. Extraocular movements intact. No facial asymmetry.  Motor: moves all extremities symmetrically at least anti-gravity x 4. Gait narrow-based and steady, no ataxia.    IMPRESSION: This is a 57 yo RH woman with a history of pulmonary embolism, migraines, and generalized epilepsy. She is in a lot of distress due to caregiver fatigue, reporting poor sleep, more headaches, significant stress. She denies any typical seizures since 2008, continue Lamotrigine 100mg  BID, oxcarbazepine 600mg  BID, and Topiramate 150mg  BID. In the past, sleep and mood improved with Cymbalta prescribed for migraine prophylaxis, we discussed restarting Cymbalta 30mg  at bedtime, may uptitrate as tolerated. We discussed minimizing over the counter pain medication to 2-3 a week to avoid rebound headaches. She was advised to speak to their PCP about getting in touch with a social worker to help navigate help needed at home, she has significant caregiver fatigue. She is aware of [place] driving laws to stop driving after an episode of loss of awareness until 6 months seizure-free. Follow-up in 6 months, call for any changes.    Thank you for allowing me to participate in her care.  Please do not hesitate to call for any questions or concerns.    Patrcia Dolly, M.D.   CC: Dr. Tamera Punt

## 2023-03-16 NOTE — Patient Instructions (Signed)
Restart Duloxetine (Cymbalta) 30mg : take 1 tablet every night  2. Continue Topiramate 50mg : take 3 tablets twice a day  3. Continue Lamotrigine 100mg  twice a day  4. Continue Oxcarbazepine 600mg  twice a day  5. Please speak to your PCP about having a social worker help you navigate the caregiving process  6. Follow-up in 6 months, call for any changes   Seizure Precautions: 1. If medication has been prescribed for you to prevent seizures, take it exactly as directed.  Do not stop taking the medicine without talking to your doctor first, even if you have not had a seizure in a long time.   2. Avoid activities in which a seizure would cause danger to yourself or to others.  Don't operate dangerous machinery, swim alone, or climb in high or dangerous places, such as on ladders, roofs, or girders.  Do not drive unless your doctor says you may.  3. If you have any warning that you may have a seizure, lay down in a safe place where you can't hurt yourself.    4.  No driving for 6 months from last seizure, as per [facility].   Please refer to the following link on the Epilepsy Foundation of America's website for more information: http://www.epilepsyfoundation.org/answerplace/Social/driving/drivingu.cfm   5.  Maintain good sleep hygiene. Avoid alcohol.  6.  Contact your doctor if you have any problems that may be related to the medicine you are taking.  7.  Call 911 and bring the patient back to the ED if:        A.  The seizure lasts longer than 5 minutes.       B.  The patient doesn't awaken shortly after the seizure  C.  The patient has new problems such as difficulty seeing, speaking or moving  D.  The patient was injured during the seizure  E.  The patient has a temperature over 102 F (39C)  F.  The patient vomited and now is having trouble breathing

## 2023-04-08 ENCOUNTER — Telehealth: Payer: Self-pay

## 2023-04-08 NOTE — Telephone Encounter (Signed)
handled

## 2023-04-08 NOTE — Telephone Encounter (Signed)
Patient came by office to drop off employment form, and patient also needs tdap vaccine for employment, form placed in Dr. Tamera Punt box, thanks.

## 2023-04-12 ENCOUNTER — Ambulatory Visit (INDEPENDENT_AMBULATORY_CARE_PROVIDER_SITE_OTHER): Payer: 59 | Admitting: Family Medicine

## 2023-04-12 DIAGNOSIS — Z111 Encounter for screening for respiratory tuberculosis: Secondary | ICD-10-CM

## 2023-04-12 NOTE — Progress Notes (Signed)
Patient ID: Laurie Silva, female   DOB: 08-15-1966, 57 y.o.   MRN: [id number] Medical screening examination/treatment was performed by qualified clinical staff member and as supervising physician I was immediately available for consultation/collaboration. I have reviewed documentation and agree with assessment and plan.  Colbert Coyer. Tamera Punt, DO

## 2023-04-12 NOTE — Progress Notes (Signed)
   Established Patient Office Visit  Subjective   Patient ID: Laurie Silva, female    DOB: December 29, 1965  Age: 57 y.o. MRN: [id number]  Chief Complaint  Patient presents with   PPD Placement    HPI  Laurie Silva is here for PPD placement.   ROS    Objective:     There were no vitals taken for this visit.   Physical Exam   No results found for any visits on 04/12/23.    The ASCVD Risk score (Arnett DK, et al., 2019) failed to calculate for the following reasons:   Cannot find a previous HDL lab   Cannot find a previous total cholesterol lab    Assessment & Plan:  PPD placement - Patient tolerated injection well without complications. Patient advised to schedule follow up in 2-3 days for PPD reading.   Problem List Items Addressed This Visit   None Visit Diagnoses     Screening-pulmonary TB    -  Primary   Relevant Orders   PPD (Completed)       Return in about 2 days (around 04/14/2023) for PPD reading. Earna Coder, Janalyn Harder, CMA

## 2023-04-14 ENCOUNTER — Ambulatory Visit (INDEPENDENT_AMBULATORY_CARE_PROVIDER_SITE_OTHER): Payer: 59 | Admitting: Family Medicine

## 2023-04-14 VITALS — BP 114/63 | HR 58 | Ht 62.0 in

## 2023-04-14 DIAGNOSIS — Z111 Encounter for screening for respiratory tuberculosis: Secondary | ICD-10-CM | POA: Diagnosis not present

## 2023-04-14 DIAGNOSIS — G40909 Epilepsy, unspecified, not intractable, without status epilepticus: Secondary | ICD-10-CM | POA: Insufficient documentation

## 2023-04-14 NOTE — Progress Notes (Signed)
Patient ID: Laurie Silva, female   DOB: 08-15-1966, 57 y.o.   MRN: [id number] Medical screening examination/treatment was performed by qualified clinical staff member and as supervising physician I was immediately available for consultation/collaboration. I have reviewed documentation and agree with assessment and plan.  Colbert Coyer. Tamera Punt, DO

## 2023-04-14 NOTE — Progress Notes (Signed)
   Established Patient Office Visit  Subjective   Patient ID: Laurie Silva, female    DOB: [birth year]  Age: 57 y.o. MRN: [id number]  Chief Complaint  Patient presents with   PPD Reading    10mm induration. Patient without risk factors or symptoms. Cleared by Dr. Tamera Punt.     HPI  Patient in office for PPD read and form completion for employment.   ROS    Objective:     BP 114/63   Pulse (!) 58   Ht 5\' 2"  (1.575 m)   SpO2 100%   BMI 27.33 kg/m    Physical Exam   No results found for any visits on 04/14/23.    The ASCVD Risk score (Arnett DK, et al., 2019) failed to calculate for the following reasons:   Cannot find a previous HDL lab   Cannot find a previous total cholesterol lab    Assessment & Plan:  Resulted 10mm induration - patient without symptoms or risk factors. Cleared for employment by Dr. Tamera Punt.  Problem List Items Addressed This Visit   None   No follow-ups on file.    Elizabeth Palau, LPN

## 2023-07-09 IMAGING — MR MR HEAD WO/W CM
14 series · 48 of 48 positions shown · IV contrast (multihance)
Comparison: None.

CLINICAL DATA: Left inferior quadrantanopia and chronic daily
headaches

EXAM:
MRI HEAD WITHOUT AND WITH CONTRAST
TECHNIQUE: Multiplanar, multiecho pulse sequences of the brain and surrounding
structures were obtained without and with intravenous contrast.
CONTRAST:  15mL MULTIHANCE GADOBENATE DIMEGLUMINE 529 MG/ML IV SOLN

[Series 2: T1 · sagittal · 5.0mm · 0.45mm/px · 2 of 25 slices shown]
[im 1/25]
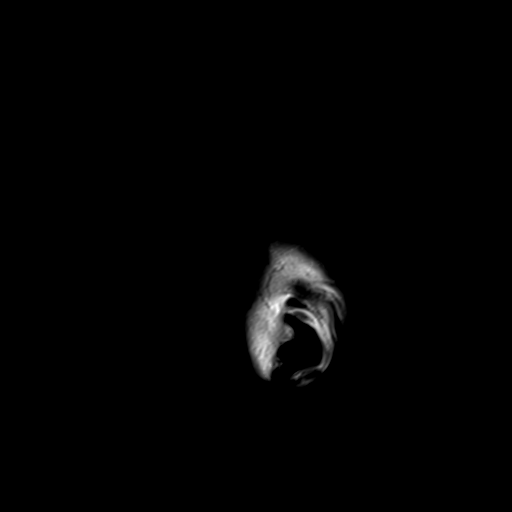
[im 25/25]
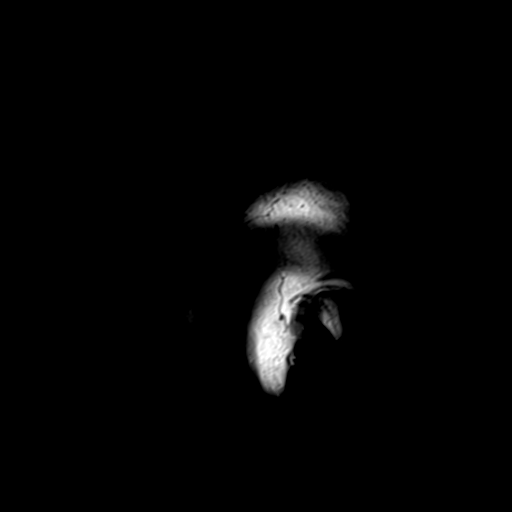

[Series 3: ax ep2d_diff_3 · axial · 3.0mm · 1.80mm/px · z∈[-86,+70]mm · 6 of 108 slices shown]
[im 1/108]
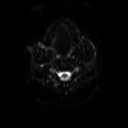
[im 22/108]
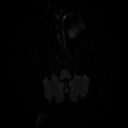
[im 43/108]
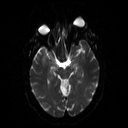
[im 65/108]
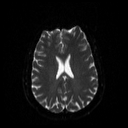
[im 86/108]
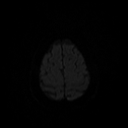
[im 108/108]
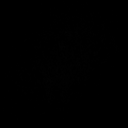

[Series 4: ax ep2d_diff_3_adc · axial · 3.0mm · 1.80mm/px · z∈[-86,+73]mm · 3 of 55 slices shown]
[im 1/55]
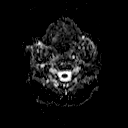
[im 28/55]
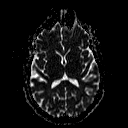
[im 55/55]
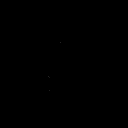

[Series 5: cor ep2d_diff · coronal · 5.0mm · 1.77mm/px · 3 of 60 slices shown]
[im 1/60]
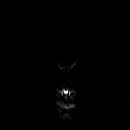
[im 30/60]
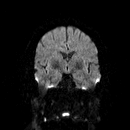
[im 60/60]
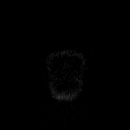

[Series 6: cor ep2d_diff_adc · coronal · 5.0mm · 1.77mm/px · 2 of 30 slices shown]
[im 1/30]
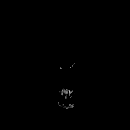
[im 30/30]
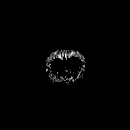

[Series 8: swi_images · axial · 2.0mm · 0.98mm/px · z∈[-69,+89]mm · 5 of 80 slices shown]
[im 1/80]
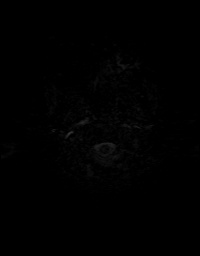
[im 20/80]
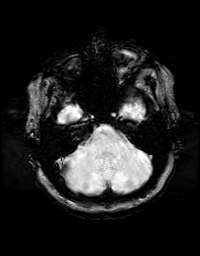
[im 40/80]
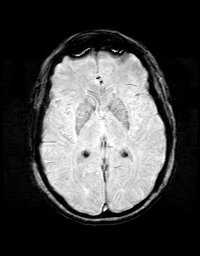
[im 60/80]
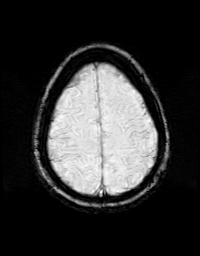
[im 80/80]
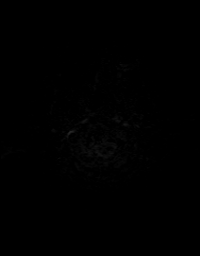

[Series 9: FLAIR · axial · 3.0mm · 0.43mm/px · z∈[-65,+87]mm · 2 of 40 slices shown (1 of 2)]
[im 1/40]
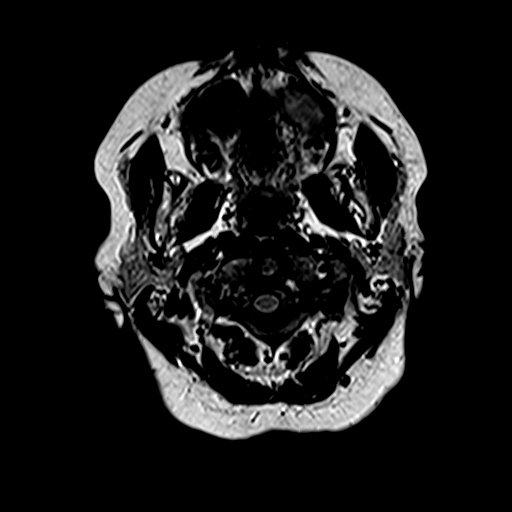
[im 40/40]
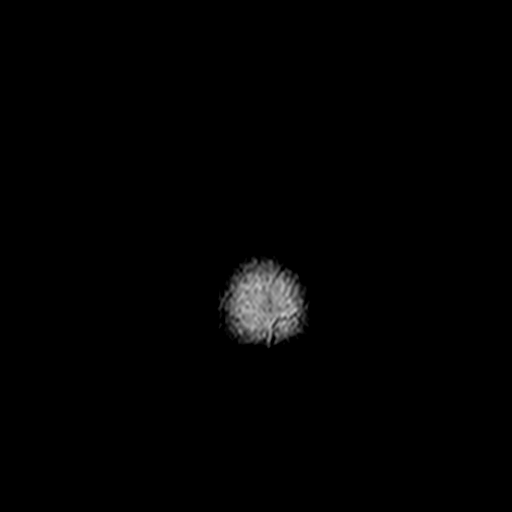

[Series 10: T2 · axial · 5.0mm · 0.65mm/px · z∈[-74,+94]mm · 2 of 29 slices shown (1 of 3)]
[im 1/29]
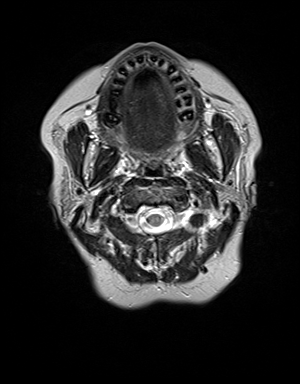
[im 29/29]
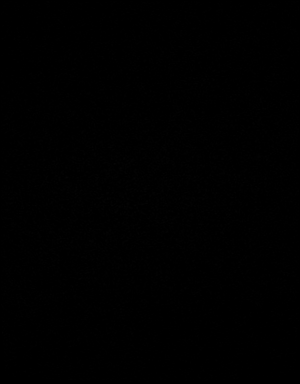

[Series 11: t1_mpr_tra · axial · 1.0mm · 0.72mm/px · z∈[-69,+90]mm · 9 of 160 slices shown]
[im 1/160]
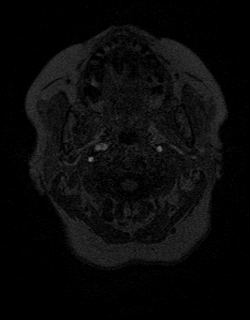
[im 20/160]
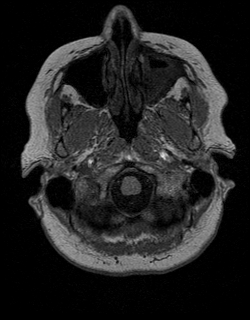
[im 40/160]
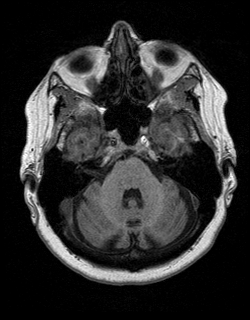
[im 60/160]
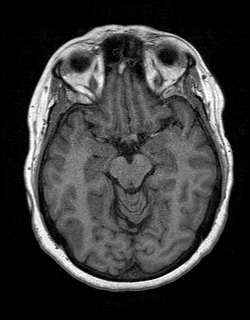
[im 80/160]
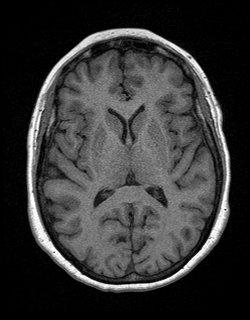
[im 100/160]
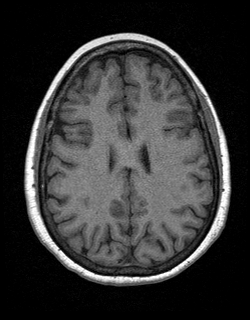
[im 120/160]
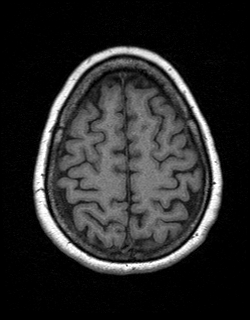
[im 140/160]
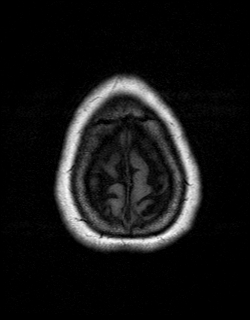
[im 160/160]
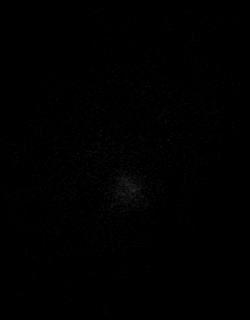

[Series 12: T2 · coronal · 3.0mm · 0.56mm/px · 1 of 25 slices shown (2 of 3)]
[im 1/25]
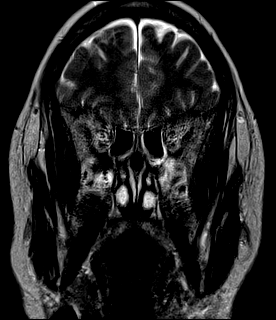

[Series 13: FLAIR · coronal · 3.0mm · 0.35mm/px · 1 of 25 slices shown (2 of 2)]
[im 1/25]
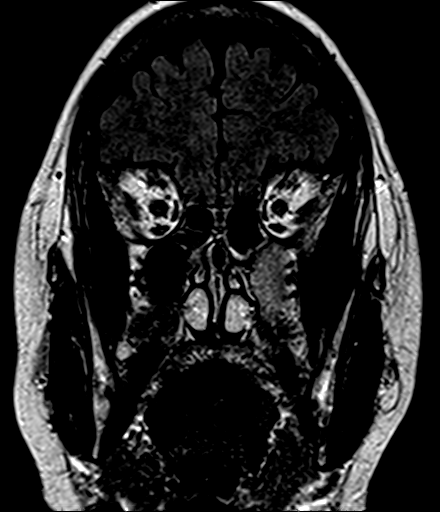

[Series 14: T2 · coronal · 5.0mm · 0.43mm/px · 2 of 28 slices shown (3 of 3)]
[im 1/28]
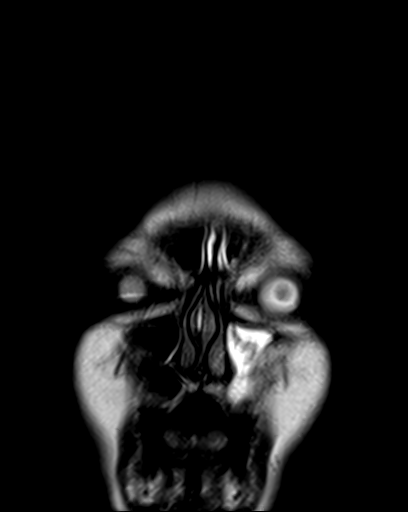
[im 28/28]
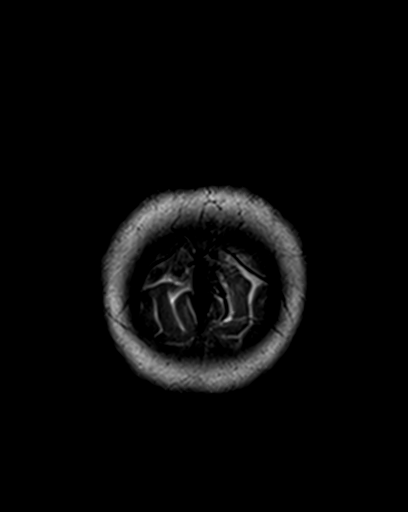

[Series 15: post t1_mpr_tra · axial · 1.0mm · 0.72mm/px · z∈[-77,+81]mm · 9 of 160 slices shown]
[im 1/160]
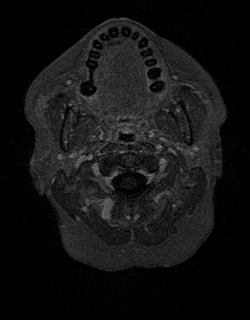
[im 20/160]
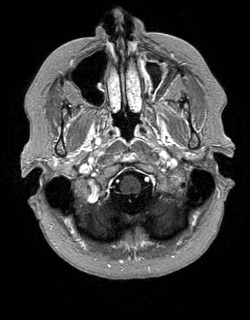
[im 40/160]
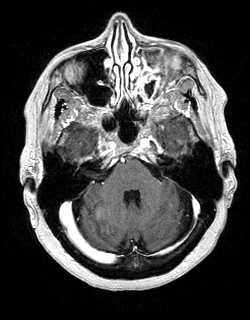
[im 60/160]
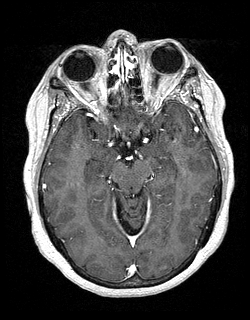
[im 80/160]
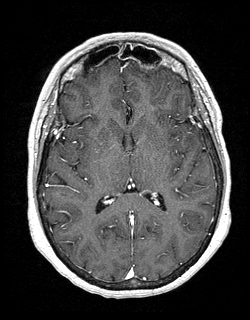
[im 100/160]
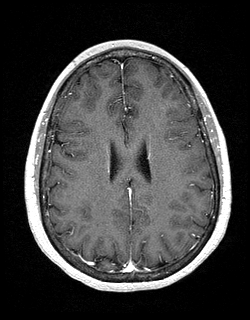
[im 120/160]
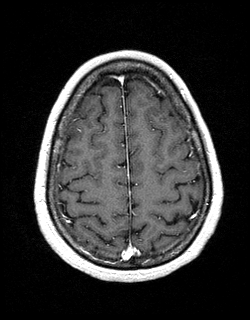
[im 140/160]
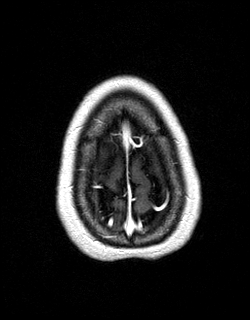
[im 160/160]
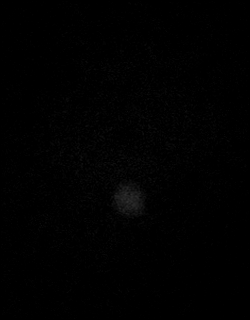

[Series 16: T1 post-contrast · coronal · 5.0mm · 0.72mm/px · 1 of 26 slices shown]
[im 1/26]
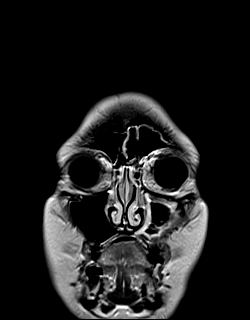

[48 of 48 positions shown; findings below may reference images not displayed]

FINDINGS: Brain: There is no acute infarction or intracranial hemorrhage.
There is no intracranial mass, mass effect, or edema. There is no
hydrocephalus or extra-axial fluid collection. Ventricles and sulci
are normal in size and configuration. No abnormal enhancement.

Vascular: Major vessel flow voids at the skull base are preserved.

Skull and upper cervical spine: Normal marrow signal is preserved.

Sinuses/Orbits: Paranasal sinus mucosal thickening. Left maxillary
sinus air-fluid level. Orbits are unremarkable.

Other: Sella is unremarkable.  Mastoid air cells are clear.
IMPRESSION: No evidence of recent infarction, hemorrhage, or mass. No abnormal
enhancement.

Left maxillary sinus air-fluid level, a nonspecific finding that can
be seen in the setting of acute sinusitis.

## 2023-09-15 DIAGNOSIS — U071 COVID-19: Secondary | ICD-10-CM

## 2023-09-15 DIAGNOSIS — Z419 Encounter for procedure for purposes other than remedying health state, unspecified: Secondary | ICD-10-CM | POA: Diagnosis not present

## 2023-09-15 HISTORY — DX: COVID-19: U07.1

## 2023-10-16 DIAGNOSIS — Z419 Encounter for procedure for purposes other than remedying health state, unspecified: Secondary | ICD-10-CM | POA: Diagnosis not present

## 2023-11-13 DIAGNOSIS — Z419 Encounter for procedure for purposes other than remedying health state, unspecified: Secondary | ICD-10-CM | POA: Diagnosis not present

## 2023-12-25 DIAGNOSIS — Z419 Encounter for procedure for purposes other than remedying health state, unspecified: Secondary | ICD-10-CM | POA: Diagnosis not present

## 2024-01-13 ENCOUNTER — Telehealth: Payer: Self-pay

## 2024-01-13 NOTE — Telephone Encounter (Signed)
 Copied from CRM [id number]. Topic: General - Other >> Jan 13, 2024  3:47 PM Kevelyn M wrote: Reason for CRM: Patient called in and stated that her husband Laurie Silva is no longer in her care. So she wants her number removed off of his chart because he left her and is in the care of a family member. Her number is [phone]. His number that should stay on his chart is the [phone]. Please call her if you have any questions.

## 2024-01-20 ENCOUNTER — Encounter: Payer: Self-pay | Admitting: Family Medicine

## 2024-01-20 NOTE — Telephone Encounter (Signed)
 01/20/24-left message on patients voicemail to contact the office regarding removing her name from spouse Laurie Silva.   I deactivated Laurie Silva from Laurie Silva's emergency contact per patient request. We would need approval from Laurie Silva to upset his emergency contact's on file.

## 2024-01-24 DIAGNOSIS — Z419 Encounter for procedure for purposes other than remedying health state, unspecified: Secondary | ICD-10-CM | POA: Diagnosis not present

## 2024-02-08 ENCOUNTER — Encounter: Payer: Self-pay | Admitting: Neurology

## 2024-02-08 ENCOUNTER — Ambulatory Visit: Admitting: Neurology

## 2024-02-08 ENCOUNTER — Other Ambulatory Visit

## 2024-02-08 VITALS — BP 117/62 | HR 63 | Ht 62.0 in | Wt 149.0 lb

## 2024-02-08 DIAGNOSIS — F4323 Adjustment disorder with mixed anxiety and depressed mood: Secondary | ICD-10-CM | POA: Diagnosis not present

## 2024-02-08 DIAGNOSIS — R413 Other amnesia: Secondary | ICD-10-CM

## 2024-02-08 DIAGNOSIS — G43709 Chronic migraine without aura, not intractable, without status migrainosus: Secondary | ICD-10-CM | POA: Diagnosis not present

## 2024-02-08 DIAGNOSIS — G40309 Generalized idiopathic epilepsy and epileptic syndromes, not intractable, without status epilepticus: Secondary | ICD-10-CM

## 2024-02-08 MED ORDER — OXCARBAZEPINE 600 MG PO TABS: 600.0000 mg | ORAL_TABLET | Freq: Two times a day (BID) | ORAL | 3 refills | Status: DC

## 2024-02-08 MED ORDER — DULOXETINE HCL 30 MG PO CPEP: ORAL_CAPSULE | ORAL | 3 refills | Status: DC

## 2024-02-08 MED ORDER — LAMOTRIGINE 100 MG PO TABS: 100.0000 mg | ORAL_TABLET | Freq: Two times a day (BID) | ORAL | 3 refills | Status: DC

## 2024-02-08 MED ORDER — TOPIRAMATE 50 MG PO TABS: 150.0000 mg | ORAL_TABLET | Freq: Two times a day (BID) | ORAL | 3 refills | Status: DC

## 2024-02-08 NOTE — Progress Notes (Signed)
 NEUROLOGY FOLLOW UP OFFICE NOTE  Laurie Silva [id number] [birth year]  HISTORY OF PRESENT ILLNESS: I had the pleasure of seeing Laurie Silva in follow-up in the neurology clinic on 02/08/2024.  The patient was last seen 10 months ago for generalized epilepsy and chronic migraine. She is alone in the office today.  Records and images were personally reviewed where available. Since her last visit, she reports doing well from a seizure standpoint, none since 2008 on Lamotrigine  100mg  BID, Oxcarbazepine  600mg  BID, and Topiramate  150mg  BID. She has a history of migraines and reports the headaches are okay, only occurring once in a while with weather changes. She has a "touch of one" today. She reports memory changes. Her daughter and granddaughter moved in with them and has noticed it more. She would forget conversations, talking about doing something but not remembering. A couple of times she has forgotten to pick up her granddaughter from [place]. She has forgotten to pay bills. She denies getting lost driving. She has missed "maybe one" dose of medications. She has a part time job at [employer], her daughters are helping with house payments. She has been the primary caregiver of her husband with cognitive changes and reports that he left her a month ago. He apparently is staying with an estranged family member and changed his number. She does not know his address or phone number. Her mother passed away last 2024-05-21, she states "I don't have my rock." She does not sleep a lot, getting only 2 hours of sleep, endorsing a lot of stress although less with her husband moving out. She reports a lot of anxiety. She was restarted on Cymbalta  on last visit since she reported it helped with sleep and mood, but has not been taking it for unclear reasons.   History on Initial Assessment 07/01/2021: This is a 58 year old right-handed woman with a history of pulmonary embolism, migraines, and generalized  epilepsy, presenting to establish care. Records from her epileptologist Dr. Alfreida Anon were reviewed. She was previously followed by Dr. Monte Antonio and Dr. Janita Mellow. Seizures started at age 15. She had staring spells as a child. She denies any myoclonic jerks. No warning prior to her convulsions, she states her last seizure was in 2008. EMU evaluation in 2007 was abnormal with generalized spike and wave discharges, at which time one GTCS was captured during the admission. There is a brain MRI with and without contrast done 06/2019 which was normal. She has tried multiple ASMs in the past, including Dilantin (ineffective), Phenobarbital (ineffective), Keppra (ineffective), Tegretol (initially worked), Neurontin, Zonegran, Zarontin, Depakote (rash), and Lamictal  (rash). She had a long history of being on brand name medication but switched to generics due to economic issues with no side effects or breakthrough seizures. She has been on chronic treatment with combination Lamotrigine  100mg  BID, Oxcarbazepine  600mg  BID, and Topiramate  50mg  3 tabs BID (150mg  BID) without side effects. We discussed note on her chart of last seizure being in 2019, and clarified that it was not a seizure, she had a viral illness at that time and Dr. Alfreida Anon' note in 12/2017 indicated "history most compatible with syncope related to viral illness and neuropathy making her unstable."  She lives with her husband and is his primary caregiver causing significant amount of stress. She denies any staring/unresponsive episodes, gaps in time, olfactory/gustatory hallucinations, deja vu, rising epigastric sensation, focal numbness/tingling/weakness, myoclonic jerks. She has a history of migraines and has had chronic daily headaches for at least a  year, taking 2 Tylenol every night for the past year. Pain is diffuse, there is no associated nausea/vomiting. When pain is severe, she has sensitivity to lights and sounds. She has a headache now. She has  occasional dizziness. She states her back always hurts. No bowel/bladder dysfunction. She brings notes from Dr. 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 Memory is "just like any 58 year old." She denies missing medications regularly, denies getting lost driving. She states there is a lot going on at home, she does not sleep, if she gets 3 hours of sleep she is good. Mood is "stressed." She works part-time as an Government social research officer.   Epilepsy Risk Factors:  Her daughter had seizures at age 59 that she outgrew, her 68 year old granddaughter has epilepsy. She recalls a pneumonia with a fever of 107 at age 58 but does not recall febrile convulsions. She had a normal birth and early development.  There is no history of CNS infections such as meningitis/encephalitis, significant traumatic brain injury, neurosurgical procedures  Prior ASMs: Dilantin (ineffective), Phenobarbital (ineffective), Keppra (ineffective), Tegretol (initially worked), Neurontin, Zonegran, Zarontin, Depakote (rash), and Lamictal  (rash) Prior headache preventative medications: Depakote, Zonisamide, Neurontin, Cymbalta , nortriptyline    Diagnostic Data: EEGs:EMU evaluation in 2007 was abnormal with generalized spike and wave discharges, at which time one GTCS was captured during the admission.  OZH:YQMVH is a brain MRI with and without contrast done 06/2019 which was normal.   PAST MEDICAL HISTORY: Past Medical History:  Diagnosis Date   Glaucoma    Hx of migraines    Macular degeneration    Pulmonary embolism (HCC)    Seizures (HCC)     MEDICATIONS: Current Outpatient Medications on File Prior to Visit  Medication Sig Dispense Refill   lamoTRIgine  (LAMICTAL ) 100 MG tablet Take 1 tablet (100 mg total) by mouth 2 (two) times daily. 180 tablet 3   oxcarbazepine  (TRILEPTAL ) 600 MG tablet Take 1 tablet (600 mg total) by  mouth 2 (two) times daily. 180 tablet 3   topiramate  (TOPAMAX ) 50 MG tablet Take 3 tablets (150 mg total) by mouth 2 (two) times daily. 540 tablet 3   No current facility-administered medications on file prior to visit.    ALLERGIES: Allergies  Allergen Reactions   Brinzolamide-Brimonidine Anaphylaxis    itching itching itching itching    Neomycin Anaphylaxis and Swelling   Valproic Acid Hives, Other (See Comments) and Rash    insomnia insomnia insomnia insomnia insomnia insomnia insomnia insomnia insomnia insomnia insomnia insomnia    Cefaclor Rash    Other reaction(s): Unknown   Hydrocodone-Acetaminophen Rash   Latex Rash   Phenobarbital Other (See Comments)    agitation agitation agitation agitation agitation agitation agitation agitation     FAMILY HISTORY: Family History  Problem Relation Age of Onset   Breast cancer Mother    Macular degeneration Mother    Stroke Father    Dementia Father    Diabetes Father    Glaucoma Father     SOCIAL HISTORY: Social History   Socioeconomic History   Marital status: Married    Spouse name: Not on file   Number of children: 4   Years of education: Not on file   Highest education level: Not on file  Occupational History   Occupation: CASHIER  Tobacco Use   Smoking status: Never   Smokeless tobacco: Never  Vaping Use  Vaping status: Never Used  Substance and Sexual Activity   Alcohol use: Not Currently   Drug use: Never   Sexual activity: Not on file  Other Topics Concern   Not on file  Social History Narrative   Right Handed    Lives in a two story home but resides on the first floor    Drinks no caffeine    Social Drivers of Corporate investment banker Strain: Not on file  Food Insecurity: Not on file  Transportation Needs: Not on file  Physical Activity: Not on file  Stress: Not on file  Social Connections: Unknown (01/17/2022)   Received from [facility], [facility]   Social  Network    Social Network: Not on file  Intimate Partner Violence: Unknown (12/16/2021)   Received from [facility], [facility]   HITS    Physically Hurt: Not on file    Insult or Talk Down To: Not on file    Threaten Physical Harm: Not on file    Scream or Curse: Not on file     PHYSICAL EXAM: Vitals:   02/08/24 0834  BP: 117/62  Pulse: 63  SpO2: 100%   General: No acute distress, anxious, verbose needing redirection Head:  Normocephalic/atraumatic Skin/Extremities: No rash, no edema Neurological Exam: alert and awake. No aphasia or dysarthria. Fund of knowledge is appropriate.  Attention and concentration are reduced. Cranial nerves: Pupils equal, round. Extraocular movements intact with no nystagmus. Visual fields full.  No facial asymmetry.  Motor: Bulk and tone normal, muscle strength 5/5 throughout with no pronator drift.   Finger to nose testing intact.  Gait narrow-based and steady, no ataxia.   IMPRESSION: This is a 58 yo RH woman with a history of pulmonary embolism, migraines, and generalized epilepsy. She denies any seizures since 2008, migraines are controlled. Continue Lamotrigine  100mg  BID, oxcarbazepine  600mg  BID, and Topiramate  150mg  BID. Main concern today is memory, she is under a lot of stress and has not been sleeping well, I suspect this is the underlying cause. Check TSH and B12. Encouraged to see a grief counselor. She is agreeable to restarting Cymbalta  30mg  at bedtime. She is aware of [place] driving laws to stop driving after a seizure until 6 months seizure-free. Follow-up in 4-5 months, call for any changes.    Thank you for allowing me to participate in her care.  Please do not hesitate to call for any questions or concerns.    Rayfield Cairo, M.D.   CC: Dr. Dianah Fort

## 2024-02-08 NOTE — Patient Instructions (Addendum)
 Good to see you.  Restart Cymbalta  (Duloxetine ) 30mg : take 1 capsule every night to help get better sleep and help with mood/anxiety  2. Continue Lamotrigine  100mg  twice a day, Oxcarbazepine  600mg  twice a day, and Topiramate  50mg : take 3 tablets twice a day  3. Have bloodwork for TSH and B12  4. Recommend seeing a grief counselor  5. Follow-up in 4-5 months, call for any changes   Seizure Precautions: 1. If medication has been prescribed for you to prevent seizures, take it exactly as directed.  Do not stop taking the medicine without talking to your doctor first, even if you have not had a seizure in a long time.   2. Avoid activities in which a seizure would cause danger to yourself or to others.  Don't operate dangerous machinery, swim alone, or climb in high or dangerous places, such as on ladders, roofs, or girders.  Do not drive unless your doctor says you may.  3. If you have any warning that you may have a seizure, lay down in a safe place where you can't hurt yourself.    4.  No driving for 6 months from last seizure, as per [place]  state law.   Please refer to the following link on the Epilepsy Foundation of America's website for more information: http://www.epilepsyfoundation.org/answerplace/Social/driving/drivingu.cfm   5.  Maintain good sleep hygiene. Avoid alcohol.  6.  Contact your doctor if you have any problems that may be related to the medicine you are taking.  7.  Call 911 and bring the patient back to the ED if:        A.  The seizure lasts longer than 5 minutes.       B.  The patient doesn't awaken shortly after the seizure  C.  The patient has new problems such as difficulty seeing, speaking or moving  D.  The patient was injured during the seizure  E.  The patient has a temperature over 102 F (39C)  F.  The patient vomited and now is having trouble breathing

## 2024-02-09 ENCOUNTER — Ambulatory Visit: Payer: Self-pay | Admitting: Neurology

## 2024-02-09 LAB — VITAMIN B12: Vitamin B-12: 337 pg/mL (ref 200–1100)

## 2024-02-09 LAB — TSH: TSH: 1.9 m[IU]/L (ref 0.40–4.50)

## 2024-02-15 ENCOUNTER — Ambulatory Visit: Payer: 59 | Admitting: Family Medicine

## 2024-02-23 NOTE — Telephone Encounter (Signed)
 Pt called informed that bloodwork is normal,

## 2024-02-23 NOTE — Telephone Encounter (Signed)
-----   Message from Laurie Silva sent at 02/09/2024 10:42 PM EDT ----- Pls let her know bloodwork is normal, thanks

## 2024-02-24 ENCOUNTER — Telehealth: Payer: Self-pay | Admitting: *Deleted

## 2024-02-24 DIAGNOSIS — Z419 Encounter for procedure for purposes other than remedying health state, unspecified: Secondary | ICD-10-CM | POA: Diagnosis not present

## 2024-02-24 NOTE — Telephone Encounter (Signed)
 Returned call from 11:28 AM. Left patient a message about New patient appointment and that a mammogram order will be placed once she has been seen for the first time for care.

## 2024-02-25 DIAGNOSIS — M1711 Unilateral primary osteoarthritis, right knee: Secondary | ICD-10-CM | POA: Diagnosis not present

## 2024-02-25 DIAGNOSIS — M25561 Pain in right knee: Secondary | ICD-10-CM | POA: Diagnosis not present

## 2024-02-25 DIAGNOSIS — Z96652 Presence of left artificial knee joint: Secondary | ICD-10-CM | POA: Diagnosis not present

## 2024-02-25 DIAGNOSIS — M25562 Pain in left knee: Secondary | ICD-10-CM | POA: Diagnosis not present

## 2024-03-01 ENCOUNTER — Encounter: Payer: Self-pay | Admitting: Obstetrics and Gynecology

## 2024-03-01 ENCOUNTER — Ambulatory Visit: Admitting: Obstetrics and Gynecology

## 2024-03-01 ENCOUNTER — Other Ambulatory Visit (HOSPITAL_COMMUNITY)
Admission: RE | Admit: 2024-03-01 | Discharge: 2024-03-01 | Disposition: A | Discharge status: Home / Self Care | Source: Ambulatory Visit | Attending: Obstetrics and Gynecology | Admitting: Obstetrics and Gynecology

## 2024-03-01 VITALS — BP 120/76 | HR 55 | Ht 62.5 in | Wt 148.0 lb

## 2024-03-01 DIAGNOSIS — Z113 Encounter for screening for infections with a predominantly sexual mode of transmission: Secondary | ICD-10-CM

## 2024-03-01 DIAGNOSIS — Z01419 Encounter for gynecological examination (general) (routine) without abnormal findings: Secondary | ICD-10-CM | POA: Insufficient documentation

## 2024-03-01 DIAGNOSIS — Z1211 Encounter for screening for malignant neoplasm of colon: Secondary | ICD-10-CM

## 2024-03-01 NOTE — Progress Notes (Unsigned)
   ANNUAL EXAM Patient name: Laurie Silva MRN [id number]  Date of birth: 1965/10/22 Chief Complaint:   No chief complaint on file.  History of Present Illness:   Laurie Silva is a 58 y.o. menopausal G5P0 being seen today for a routine annual exam.  Current complaints: *** Dig for op note 2012 Last pap ***. Results were: {Pap findings:25134}. H/O abnormal pap: {yes/yes***/no:23866} Last mammogram: 2023. Results were: {normal, abnormal, n/a:23837}. Family h/o breast cancer: {yes***/no:23838} Last colonoscopy: ***. Results were: {normal, abnormal, n/a:23837}. Family h/o colorectal cancer: {yes***/no:23838}      No data to display               No data to display           Review of Systems:   Pertinent items are noted in HPI Denies any headaches, blurred vision, fatigue, shortness of breath, chest pain, abdominal pain, abnormal vaginal discharge/itching/odor/irritation, problems with periods, bowel movements, urination, or intercourse unless otherwise stated above. Pertinent History Reviewed:  Reviewed past medical,surgical, social and family history.  Reviewed problem list, medications and allergies. Physical Assessment:   Vitals:   03/01/24 1415  Weight: 148 lb (67.1 kg)  Height: 5' 2.5 (1.588 m)  Body mass index is 26.64 kg/m.        Physical Examination:   General appearance - well appearing, and in no distress  Mental status - alert, oriented to person, place, and time  Chest - respiratory effort normal  Heart - normal peripheral perfusion  Breasts - breasts appear normal, no suspicious masses, no skin or nipple changes or axillary nodes  Abdomen - soft, nontender, nondistended, no masses or organomegaly  Pelvic - VULVA: normal appearing vulva with no masses, tenderness or lesions  VAGINA: normal appearing vagina with normal color and discharge, no lesions  CERVIX: normal appearing cervix without discharge or lesions, no CMT  Thin prep pap is {Desc;  done/not:10129} *** HR HPV cotesting  UTERUS: uterus is felt to be normal size, shape, consistency and nontender   ADNEXA: No adnexal masses or tenderness noted.  Chaperone present for exam  No results found for this or any previous visit (from the past 24 hours).  Assessment & Plan:  1) Well-Woman Exam Mammogram: {Mammo f/u:25212::@ 58yo}, or sooner if problems Colonoscopy: {TCS f/u:25213::@ 58yo}, or sooner if problems Pap:  2) ***  Labs/procedures today: ***  No orders of the defined types were placed in this encounter.   Meds: No orders of the defined types were placed in this encounter.   Follow-up: No follow-ups on file.  Izell Marsh, MD 03/01/2024 2:21 PM

## 2024-03-01 NOTE — Patient Instructions (Signed)
 It was nice meeting you today. You will see your results in the MyChart app within 1 week.

## 2024-03-02 LAB — RPR+HBSAG+HCVAB+...
HIV Screen 4th Generation wRfx: NONREACTIVE
Hep C Virus Ab: NONREACTIVE
Hepatitis B Surface Ag: NEGATIVE
RPR Ser Ql: NONREACTIVE

## 2024-03-03 ENCOUNTER — Ambulatory Visit: Payer: Self-pay | Admitting: Obstetrics and Gynecology

## 2024-03-06 ENCOUNTER — Encounter: Payer: Self-pay | Admitting: Obstetrics and Gynecology

## 2024-03-06 LAB — CYTOLOGY - PAP
Chlamydia: NEGATIVE
Comment: NEGATIVE
Comment: NEGATIVE
Comment: NEGATIVE
Comment: NORMAL
Diagnosis: NEGATIVE
Diagnosis: REACTIVE
High risk HPV: NEGATIVE
Neisseria Gonorrhea: NEGATIVE
Trichomonas: NEGATIVE

## 2024-03-07 ENCOUNTER — Telehealth: Payer: Self-pay

## 2024-03-07 NOTE — Telephone Encounter (Signed)
 RN attempted telephone call regarding recent results per request of Dr. Erik, left HIPAA compliant voicemail to return RN call.  Silvano LELON Piano, RN

## 2024-03-09 NOTE — Progress Notes (Signed)
 Pt came into clinic to request most recent results. Per Dr. Erik, pap and lab results normal and to follow up in 5 years. Pt reported new symptoms within the last week sweating at night unsure if going through menopause. RN advised for patient to schedule follow up appointment with provider to discuss concerns.   Silvano LELON Piano, RN

## 2024-03-15 ENCOUNTER — Ambulatory Visit: Admitting: Obstetrics and Gynecology

## 2024-03-21 DIAGNOSIS — H353132 Nonexudative age-related macular degeneration, bilateral, intermediate dry stage: Secondary | ICD-10-CM | POA: Diagnosis not present

## 2024-03-21 DIAGNOSIS — H401133 Primary open-angle glaucoma, bilateral, severe stage: Secondary | ICD-10-CM | POA: Diagnosis not present

## 2024-03-21 DIAGNOSIS — H2513 Age-related nuclear cataract, bilateral: Secondary | ICD-10-CM | POA: Diagnosis not present

## 2024-03-21 DIAGNOSIS — H43823 Vitreomacular adhesion, bilateral: Secondary | ICD-10-CM | POA: Diagnosis not present

## 2024-03-21 DIAGNOSIS — D3131 Benign neoplasm of right choroid: Secondary | ICD-10-CM | POA: Diagnosis not present

## 2024-03-21 NOTE — Addendum Note (Signed)
 Addended by: ORLINDA SILVANO ORN on: 03/21/2024 04:42 PM   Modules accepted: Orders

## 2024-03-22 ENCOUNTER — Telehealth: Payer: Self-pay

## 2024-03-22 NOTE — Telephone Encounter (Signed)
 RN received message to call patient. Patient reported received a telephone call from [place] office was unsure of reason. RN reported it could have been the automated system reminding her of her upcoming appointment and that office staff had not reached out since June. No concerns reported, pt reported plans to attend follow up appointment on 7/16.  Silvano LELON Piano, RN

## 2024-03-23 ENCOUNTER — Ambulatory Visit

## 2024-03-25 DIAGNOSIS — Z419 Encounter for procedure for purposes other than remedying health state, unspecified: Secondary | ICD-10-CM | POA: Diagnosis not present

## 2024-03-29 ENCOUNTER — Encounter: Payer: Self-pay | Admitting: Obstetrics and Gynecology

## 2024-03-29 ENCOUNTER — Ambulatory Visit: Admitting: Obstetrics and Gynecology

## 2024-03-29 VITALS — BP 119/75 | HR 64 | Ht 63.5 in | Wt 144.0 lb

## 2024-03-29 DIAGNOSIS — R61 Generalized hyperhidrosis: Secondary | ICD-10-CM | POA: Diagnosis not present

## 2024-03-29 NOTE — Progress Notes (Signed)
   RETURN GYNECOLOGY VISIT  Subjective:  Laurie Silva is a menopausal 58 y.o. presenting for discussion of potential menopause symptoms.   For the past month she has had night sweats & sleep disturbances. Notes more irritability. Is wondering if this is menopause related changes. Notes that she is sleeping around 1 hour per night. Wakes up and watches TV.   Has previously been prescribed cymbalta  by PCP for sleep/mood (see note 02/08/24), but she did not start it  She has a history of pulmonary embolism  Objective:   Vitals:   03/29/24 1103  BP: 119/75  Pulse: 64  Weight: 144 lb (65.3 kg)  Height: 5' 3.5 (1.613 m)   General:  Alert, oriented and cooperative. Patient is in no acute distress.  Skin: Skin is warm and dry. No rash noted.   Cardiovascular: Normal heart rate noted  Respiratory: Normal respiratory effort, no problems with respiration noted   Assessment and Plan:  Laurie Silva is a 58 y.o. with night sweats, irritability, and difficulty with sleep  Night sweats - Discussed menopause would not explain 1 month of symptoms, esp as we suspect she went through menopause several years ago  - Recently had HIV testing that was negative, weight is stable from previous appointment - Discussed that even if this were menopause-related, she is not a candidate for hormone therapies due to her history of PE - Reviewed sleep hygiene and avoidance of TV/screens around bed time or when she is having trouble sleeping - Discussed role for cymbalta  with helping mood, sleep disturbance, and night sweats. She will trial this - Recommended PCP follow up esp if symptoms do not improve with cymbalta   Return in about 4 weeks (around 04/26/2024) for follow up hot flashes.  Future Appointments  Date Time Provider Department Center  04/05/2024  1:40 PM [facility]  04/25/2024  9:20 AM Lowella Folks L, PA [facility] None  04/26/2024 10:50 AM Cleatus Moccasin, MD [id number]  [facility]  07/19/2024 11:30 AM Georjean Darice HERO, MD [facility] None   Kieth JAYSON Carolin, MD

## 2024-04-05 ENCOUNTER — Ambulatory Visit

## 2024-04-05 DIAGNOSIS — Z1231 Encounter for screening mammogram for malignant neoplasm of breast: Secondary | ICD-10-CM

## 2024-04-05 DIAGNOSIS — Z01419 Encounter for gynecological examination (general) (routine) without abnormal findings: Secondary | ICD-10-CM

## 2024-04-25 ENCOUNTER — Ambulatory Visit (INDEPENDENT_AMBULATORY_CARE_PROVIDER_SITE_OTHER): Admitting: Urgent Care

## 2024-04-25 ENCOUNTER — Encounter: Payer: Self-pay | Admitting: Urgent Care

## 2024-04-25 VITALS — BP 112/67 | HR 63 | Temp 97.6°F | Ht 63.5 in | Wt 146.1 lb

## 2024-04-25 DIAGNOSIS — Z0000 Encounter for general adult medical examination without abnormal findings: Secondary | ICD-10-CM

## 2024-04-25 DIAGNOSIS — Z419 Encounter for procedure for purposes other than remedying health state, unspecified: Secondary | ICD-10-CM | POA: Diagnosis not present

## 2024-04-25 DIAGNOSIS — G40909 Epilepsy, unspecified, not intractable, without status epilepticus: Secondary | ICD-10-CM

## 2024-04-25 DIAGNOSIS — R634 Abnormal weight loss: Secondary | ICD-10-CM

## 2024-04-25 DIAGNOSIS — Z79899 Other long term (current) drug therapy: Secondary | ICD-10-CM | POA: Diagnosis not present

## 2024-04-25 NOTE — Progress Notes (Signed)
 Established Patient Office Visit  Subjective:  Patient ID: Laurie Silva, female    DOB: 1966/08/12  Age: 58 y.o. MRN: [id number]  Chief Complaint  Patient presents with   Medical Management of Chronic Issues    HPI  Discussed the use of AI scribe software for clinical note transcription with the patient, who gave verbal consent to proceed.  History of Present Illness   Laurie Silva is a 58 year old female who presents for a routine physical exam.  She has experienced significant weight loss over the past few years, initially triggered by an illness a couple of years ago. Previously weighing over 200 pounds, she attributes the weight loss to stress and changes in her eating habits, noting a current reduced food intake.  She has a history of epilepsy and is currently taking Lamictal , Trileptal , and Topamax . She is not taking Cymbalta , which was previously prescribed for migraine headaches.  She experienced night sweats in the past, which have since resolved. She reports that the resolution of her night sweats occurred over time and possibly after her weight loss, and she now describes herself as 'so cold natured.'  She has been experiencing diarrhea for a prolonged period, which she attributes to stress and her current eating habits. No abdominal cramps and does not express significant concern about the diarrhea, although it has impacted her ability to complete a Cologuard test for colon cancer screening.  She recalls having pneumonia as a child in [place] and is due for a pneumonia vaccine update.  She has experienced significant stress related to personal life events, including infidelity in her marriage and the passing of her mother last August.       Patient Active Problem List   Diagnosis Date Noted   Epilepsy (HCC) 04/14/2023   Screening-pulmonary TB 04/14/2023   Plantar fasciitis of right foot 03/08/2023   Stress 03/08/2023   Diarrhea 07/09/2022   Past Medical  History:  Diagnosis Date   Glaucoma    Hx of migraines    Macular degeneration    Pulmonary embolism (HCC)    Seizures (HCC)    Past Surgical History:  Procedure Laterality Date   BREAST BIOPSY Left    per pt   ENDOMETRIAL ABLATION     TUBAL LIGATION     Social History   Tobacco Use   Smoking status: Never   Smokeless tobacco: Never  Vaping Use   Vaping status: Never Used  Substance Use Topics   Alcohol use: Yes    Comment: occ   Drug use: Never      ROS: as noted in HPI  Objective:     BP 112/67 (BP Location: Left Arm, Patient Position: Sitting, Cuff Size: Normal)   Pulse 63   Temp 97.6 F (36.4 C) (Oral)   Ht 5' 3.5 (1.613 m)   Wt 146 lb 1.9 oz (66.3 kg)   LMP  (LMP Unknown)   SpO2 100%   BMI 25.48 kg/m  BP Readings from Last 3 Encounters:  04/25/24 112/67  03/29/24 119/75  03/01/24 120/76   Wt Readings from Last 3 Encounters:  04/25/24 146 lb 1.9 oz (66.3 kg)  03/29/24 144 lb (65.3 kg)  03/01/24 148 lb (67.1 kg)      Physical Exam Vitals and nursing note reviewed.  Constitutional:      General: She is not in acute distress.    Appearance: Normal appearance. She is not ill-appearing, toxic-appearing or diaphoretic.  HENT:  Head: Normocephalic and atraumatic.     Right Ear: Tympanic membrane, ear canal and external ear normal. There is no impacted cerumen.     Left Ear: Tympanic membrane, ear canal and external ear normal. There is no impacted cerumen.     Nose: Nose normal.     Mouth/Throat:     Mouth: Mucous membranes are moist.     Pharynx: Oropharynx is clear. No oropharyngeal exudate or posterior oropharyngeal erythema.  Eyes:     General: No scleral icterus.       Right eye: No discharge.        Left eye: No discharge.     Extraocular Movements: Extraocular movements intact.     Pupils: Pupils are equal, round, and reactive to light.  Neck:     Thyroid: No thyroid mass, thyromegaly or thyroid tenderness.  Cardiovascular:      Rate and Rhythm: Normal rate and regular rhythm.     Pulses: Normal pulses.     Heart sounds: No murmur heard. Pulmonary:     Effort: Pulmonary effort is normal. No respiratory distress.     Breath sounds: Normal breath sounds. No stridor. No wheezing or rhonchi.  Abdominal:     General: Abdomen is flat. Bowel sounds are normal. There is no distension.     Palpations: Abdomen is soft. There is no mass.     Tenderness: There is no abdominal tenderness. There is no guarding.  Musculoskeletal:     Cervical back: Normal range of motion and neck supple. No rigidity or tenderness.     Right lower leg: No edema.     Left lower leg: No edema.  Lymphadenopathy:     Cervical: No cervical adenopathy.  Skin:    General: Skin is warm and dry.     Coloration: Skin is not jaundiced.     Findings: No bruising, erythema or rash.  Neurological:     General: No focal deficit present.     Mental Status: She is alert and oriented to person, place, and time.     Sensory: No sensory deficit.     Motor: No weakness.  Psychiatric:        Mood and Affect: Mood normal.        Behavior: Behavior normal.      No results found for any visits on 04/25/24.  Last CBC Lab Results  Component Value Date   WBC 5.6 07/09/2022   HGB 12.9 07/09/2022   HCT 37.9 07/09/2022   MCV 91.4 07/09/2022   RDW 12.9 07/09/2022   PLT 263.0 07/09/2022   Last metabolic panel Lab Results  Component Value Date   GLUCOSE 84 07/09/2022   NA 138 07/09/2022   K 4.0 07/09/2022   CL 103 07/09/2022   CO2 28 07/09/2022   BUN 13 07/09/2022   CREATININE 0.75 07/09/2022   GFR 88.25 07/09/2022   CALCIUM 9.5 07/09/2022   PROT 7.1 07/09/2022   ALBUMIN 4.4 07/09/2022   BILITOT 0.5 07/09/2022   ALKPHOS 78 07/09/2022   AST 15 07/09/2022   ALT 8 07/09/2022   Last lipids No results found for: CHOL, HDL, LDLCALC, LDLDIRECT, TRIG, CHOLHDL Last hemoglobin A1c No results found for: HGBA1C Last thyroid functions Lab  Results  Component Value Date   TSH 1.90 02/08/2024   Last vitamin D No results found for: 25OHVITD2, 25OHVITD3, VD25OH Last vitamin B12 and Folate Lab Results  Component Value Date   VITAMINB12 337 02/08/2024      The  ASCVD Risk score (Arnett DK, et al., 2019) failed to calculate for the following reasons:   Cannot find a previous HDL lab   Cannot find a previous total cholesterol lab  Assessment & Plan:  Routine adult health maintenance  Nonintractable epilepsy without status epilepticus, unspecified epilepsy type (HCC)  Weight loss -     CBC with Differential/Platelet -     Hemoglobin A1c -     TSH -     Lipid panel -     Comprehensive metabolic panel with GFR  Long-term use of high-risk medication  Assessment and Plan    General Health Maintenance Pneumonia and shingles vaccine due; due to insurance, will need to update at pharmacy. Cologuard test pending (pt has kit at home, just needs to complete). Recent weight loss possibly stress or diet-related. - Go to pharmacy to have shingles and pneumonia vaccine administered. - Encourage completion of Cologuard test for colon cancer screening. - Order labs to check thyroid and blood cell counts.  - pap smear and mammogram recently completed, normal  Epilepsy.  - Continue current epilepsy medications as prescribed by neurologist.  Migraine Previously managed with duloxetine . Currently not taking duloxetine  and doing well overall  Chronic diarrhea Chronic diarrhea without abdominal cramps. Possible stress-related or dietary factors. Symptoms not bothersome. - Discussed use of antispasmodic to bulk stools. - Encouraged completion of Cologuard test for colon cancer screening despite diarrhea. - defers need for GI          Return in about 1 year (around 04/25/2025) for Annual Physical.   Benton LITTIE Gave, PA

## 2024-04-25 NOTE — Patient Instructions (Signed)
 We updated your labs today. Please complete the cologuard test as ordered.  Please get your pneumonia vaccine and your shingles vaccine updated at the pharmacy.   Return annually, sooner as needed.

## 2024-04-26 ENCOUNTER — Ambulatory Visit: Admitting: Obstetrics and Gynecology

## 2024-04-26 ENCOUNTER — Ambulatory Visit: Payer: Self-pay | Admitting: Urgent Care

## 2024-04-26 LAB — CBC WITH DIFFERENTIAL/PLATELET
Basophils Absolute: 0.1 x10E3/uL (ref 0.0–0.2)
Basos: 1 %
EOS (ABSOLUTE): 0.6 x10E3/uL — ABNORMAL HIGH (ref 0.0–0.4)
Eos: 10 %
Hematocrit: 37.7 % (ref 34.0–46.6)
Hemoglobin: 12.7 g/dL (ref 11.1–15.9)
Immature Grans (Abs): 0 x10E3/uL (ref 0.0–0.1)
Immature Granulocytes: 0 %
Lymphocytes Absolute: 1.4 x10E3/uL (ref 0.7–3.1)
Lymphs: 24 %
MCH: 31.6 pg (ref 26.6–33.0)
MCHC: 33.7 g/dL (ref 31.5–35.7)
MCV: 94 fL (ref 79–97)
Monocytes Absolute: 0.4 x10E3/uL (ref 0.1–0.9)
Monocytes: 7 %
Neutrophils Absolute: 3.3 x10E3/uL (ref 1.4–7.0)
Neutrophils: 58 %
Platelets: 225 x10E3/uL (ref 150–450)
RBC: 4.02 x10E6/uL (ref 3.77–5.28)
RDW: 11.9 % (ref 11.7–15.4)
WBC: 5.7 x10E3/uL (ref 3.4–10.8)

## 2024-04-26 LAB — LIPID PANEL
Chol/HDL Ratio: 2.5 ratio (ref 0.0–4.4)
Cholesterol, Total: 180 mg/dL (ref 100–199)
HDL: 73 mg/dL (ref >39)
LDL Chol Calc (NIH): 94 mg/dL (ref 0–99)
Triglycerides: 72 mg/dL (ref 0–149)
VLDL Cholesterol Cal: 13 mg/dL (ref 5–40)

## 2024-04-26 LAB — COMPREHENSIVE METABOLIC PANEL WITH GFR
ALT: 13 IU/L (ref 0–32)
AST: 23 IU/L (ref 0–40)
Albumin: 4.1 g/dL (ref 3.8–4.9)
Alkaline Phosphatase: 80 IU/L (ref 44–121)
BUN/Creatinine Ratio: 23 (ref 9–23)
BUN: 18 mg/dL (ref 6–24)
Bilirubin Total: 0.4 mg/dL (ref 0.0–1.2)
CO2: 18 mmol/L — ABNORMAL LOW (ref 20–29)
Calcium: 9 mg/dL (ref 8.7–10.2)
Chloride: 98 mmol/L (ref 96–106)
Creatinine, Ser: 0.78 mg/dL (ref 0.57–1.00)
Globulin, Total: 2 g/dL (ref 1.5–4.5)
Glucose: 73 mg/dL (ref 70–99)
Potassium: 4.4 mmol/L (ref 3.5–5.2)
Sodium: 130 mmol/L — ABNORMAL LOW (ref 134–144)
Total Protein: 6.1 g/dL (ref 6.0–8.5)
eGFR: 87 mL/min/1.73 (ref >59)

## 2024-04-26 LAB — HEMOGLOBIN A1C
Est. average glucose Bld gHb Est-mCnc: 91 mg/dL
Hgb A1c MFr Bld: 4.8 % (ref 4.8–5.6)

## 2024-04-26 LAB — TSH: TSH: 2.04 u[IU]/mL (ref 0.450–4.500)

## 2024-05-02 ENCOUNTER — Telehealth: Payer: Self-pay

## 2024-05-02 ENCOUNTER — Ambulatory Visit: Payer: Self-pay

## 2024-05-02 NOTE — Telephone Encounter (Signed)
 No intervention needed. Soreness is a known side effect. As long as it wasn't red, hot, swollen, then tenderness should improve with time. Thanks

## 2024-05-02 NOTE — Telephone Encounter (Signed)
 Patient came into office informing of her getting the pneumonia shot on Friday and it made her Left arm hurt really bad, she was in bed, hollering and screaming due to the pain, patient states her arm is still sore but not as bad as it was over the weekend, please advise thanks.

## 2024-05-02 NOTE — Telephone Encounter (Signed)
 Appt made tomorrow 05/03/24

## 2024-05-02 NOTE — Telephone Encounter (Signed)
 FYI Only or Action Required?: FYI only for provider.  Patient was last seen in primary care on 04/25/2024 by Lowella Benton CROME, PA.  Called Nurse Triage reporting Back Pain.  Symptoms began several years ago.  Interventions attempted: OTC medications: Tylenol and Rest, hydration, or home remedies.  Symptoms are: unchanged.  Triage Disposition: See PCP When Office is Open (Within 3 Days)  Patient/caregiver understands and will follow disposition?:    Copied from CRM #[id number]. Topic: Clinical - Red Word Triage >> May 02, 2024  2:24 PM Alfonso ORN wrote: Red Word that prompted transfer to Nurse Triage: not sure if its arthritis  lower back pain , rate pain 9  pt. call back # [phone] Reason for Disposition  [1] MODERATE back pain (e.g., interferes with normal activities) AND [2] present > 3 days  Answer Assessment - Initial Assessment Questions Arthritis in knees,   1. ONSET: When did the pain begin? (e.g., minutes, hours, days)     Patient states little while  2. LOCATION: Where does it hurt? (upper, mid or lower back)     Mid/lower back  3. SEVERITY: How bad is the pain?  (e.g., Scale 1-10; mild, moderate, or severe)     9/10  4. PATTERN: Is the pain constant? (e.g., yes, no; constant, intermittent)      Mostly when moving.  5. RADIATION: Does the pain shoot into your legs or somewhere else?     No  6. CAUSE:  What do you think is causing the back pain?      Unsure, but thinks its arthritis  7. BACK OVERUSE:  Any recent lifting of heavy objects, strenuous work or exercise?     Sometimes lifts heavy objects at work.   8. MEDICINES: What have you taken so far for the pain? (e.g., nothing, acetaminophen, NSAIDS)     No  9. NEUROLOGIC SYMPTOMS: Do you have any weakness, numbness, or problems with bowel/bladder control?     No  10. OTHER SYMPTOMS: Do you have any other symptoms? (e.g., fever, abdomen pain, burning with urination, blood in  urine)       No  Protocols used: Back Pain-A-AH

## 2024-05-03 ENCOUNTER — Ambulatory Visit: Admitting: Urgent Care

## 2024-05-03 ENCOUNTER — Ambulatory Visit: Admitting: Obstetrics and Gynecology

## 2024-05-03 ENCOUNTER — Encounter: Payer: Self-pay | Admitting: Urgent Care

## 2024-05-03 VITALS — BP 107/69 | HR 62 | Resp 17 | Ht 63.5 in | Wt 146.0 lb

## 2024-05-03 DIAGNOSIS — R413 Other amnesia: Secondary | ICD-10-CM | POA: Diagnosis not present

## 2024-05-03 DIAGNOSIS — G8929 Other chronic pain: Secondary | ICD-10-CM

## 2024-05-03 DIAGNOSIS — M545 Low back pain, unspecified: Secondary | ICD-10-CM

## 2024-05-03 MED ORDER — BACLOFEN 10 MG PO TABS: 10.0000 mg | ORAL_TABLET | Freq: Three times a day (TID) | ORAL | 0 refills | Status: DC

## 2024-05-03 MED ORDER — MELOXICAM 7.5 MG PO TABS: 7.5000 mg | ORAL_TABLET | Freq: Every day | ORAL | 3 refills | Status: DC

## 2024-05-03 NOTE — Progress Notes (Signed)
 Established Patient Office Visit  Subjective:  Patient ID: Laurie Silva, female    DOB: 10/28/1965  Age: 58 y.o. MRN: [id number]  Chief Complaint  Patient presents with   Back Pain    HPI  Discussed the use of AI scribe software for clinical note transcription with the patient, who gave verbal consent to proceed.  History of Present Illness   Laurie Silva is a 58 year old female who presents with chronic lumbar back pain.  She experiences persistent lumbar back pain, exacerbated by activities such as bending, standing, cleaning, and walking. The pain is described as aching and worsens with movement; she reports feeling okay when sitting. No sharp shooting pain down the legs, tingling in the groin, or urinary symptoms are present.  She has a history of knee replacement and is currently wearing a brace for her other knee, which she plans to have replaced. A previous knee injection did not provide relief. She has not taken meloxicam  or any anti-inflammatory medications for her knee or back pain.  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 She enjoys walking in her neighborhood and has lost a significant amount of weight recently.  She reports experiencing short-term memory loss, characterized by difficulty remembering recent conversations or tasks, which has been present for about a year. She has epilepsy and is under the care of a neurologist, with a follow-up appointment scheduled in the coming months.       Patient Active Problem List   Diagnosis Date Noted   Epilepsy (HCC) 04/14/2023   Screening-pulmonary TB 04/14/2023   Plantar fasciitis of right foot 03/08/2023   Stress 03/08/2023   Diarrhea 07/09/2022   Past Medical History:  Diagnosis Date   Glaucoma    Hx of migraines    Macular degeneration    Pulmonary embolism (HCC)    Seizures (HCC)    Past  Surgical History:  Procedure Laterality Date   BREAST BIOPSY Left    per pt   ENDOMETRIAL ABLATION     TUBAL LIGATION     Social History   Tobacco Use   Smoking status: Never   Smokeless tobacco: Never  Vaping Use   Vaping status: Never Used  Substance Use Topics   Alcohol use: Yes    Comment: occ   Drug use: Never      ROS: as noted in HPI  Objective:     BP 107/69   Pulse 62   Resp 17   Ht 5' 3.5 (1.613 m)   Wt 146 lb (66.2 kg)   LMP  (LMP Unknown)   SpO2 100%   BMI 25.46 kg/m  BP Readings from Last 3 Encounters:  05/03/24 107/69  04/25/24 112/67  03/29/24 119/75   Wt Readings from Last 3 Encounters:  05/03/24 146 lb (66.2 kg)  04/25/24 146 lb 1.9 oz (66.3 kg)  03/29/24 144 lb (65.3 kg)      Physical Exam Vitals and nursing note reviewed.  Constitutional:      General: She is not in acute distress.    Appearance: Normal appearance. She is not ill-appearing, toxic-appearing or diaphoretic.  Cardiovascular:     Rate and Rhythm: Normal rate.  Pulmonary:     Effort: Pulmonary effort is normal. No respiratory distress.  Musculoskeletal:     Cervical back: Normal range of motion and neck supple. No tenderness.     Lumbar  back: Tenderness (bilateral muscular tenderness) present. No spasms or bony tenderness. Normal range of motion. Negative right straight leg raise test and negative left straight leg raise test. No scoliosis.  Lymphadenopathy:     Cervical: No cervical adenopathy.  Skin:    General: Skin is warm and dry.     Coloration: Skin is not jaundiced.     Findings: No bruising, erythema or rash.  Neurological:     Mental Status: She is alert.  Psychiatric:        Attention and Perception: Attention normal.        Mood and Affect: Mood is depressed. Affect is tearful.        Speech: Speech normal.        Behavior: Behavior is cooperative.        Cognition and Memory: Cognition is impaired. She exhibits impaired recent memory.         Judgment: Judgment normal.      No results found for any visits on 05/03/24.  Last CBC Lab Results  Component Value Date   WBC 5.7 04/25/2024   HGB 12.7 04/25/2024   HCT 37.7 04/25/2024   MCV 94 04/25/2024   MCH 31.6 04/25/2024   RDW 11.9 04/25/2024   PLT 225 04/25/2024   Last metabolic panel Lab Results  Component Value Date   GLUCOSE 73 04/25/2024   NA 130 (L) 04/25/2024   K 4.4 04/25/2024   CL 98 04/25/2024   CO2 18 (L) 04/25/2024   BUN 18 04/25/2024   CREATININE 0.78 04/25/2024   EGFR 87 04/25/2024   CALCIUM 9.0 04/25/2024   PROT 6.1 04/25/2024   ALBUMIN 4.1 04/25/2024   LABGLOB 2.0 04/25/2024   BILITOT 0.4 04/25/2024   ALKPHOS 80 04/25/2024   AST 23 04/25/2024   ALT 13 04/25/2024   Last lipids Lab Results  Component Value Date   CHOL 180 04/25/2024   HDL 73 04/25/2024   LDLCALC 94 04/25/2024   TRIG 72 04/25/2024   CHOLHDL 2.5 04/25/2024   Last hemoglobin A1c Lab Results  Component Value Date   HGBA1C 4.8 04/25/2024   Last thyroid functions Lab Results  Component Value Date   TSH 2.040 04/25/2024   Last vitamin D No results found for: MARIEN BOLLS, VD25OH Last vitamin B12 and Folate Lab Results  Component Value Date   VITAMINB12 337 02/08/2024      The 10-year ASCVD risk score (Arnett DK, et al., 2019) is: 1.6%  Assessment & Plan:  Chronic bilateral low back pain without sciatica -     Meloxicam ; Take 1 tablet (7.5 mg total) by mouth daily.  Dispense: 30 tablet; Refill: 3 -     Baclofen ; Take 1 tablet (10 mg total) by mouth 3 (three) times daily.  Dispense: 90 each; Refill: 0  Memory deficit -     Ambulatory referral to Neuropsychology -     MR BRAIN W WO CONTRAST; Future  Assessment and Plan    Chronic lumbar back pain Chronic lumbar back pain likely due to musculoskeletal issues from overuse and altered body mechanics. No radiating pain or urinary symptoms, indicating no immediate need for imaging. - Prescribe  meloxicam  every morning with breakfast. - Recommend muscle relaxer on non-working days. - Advise moist heat with microwavable heat packs. - Recommend massage device for compression after heat application. - Provide home physical therapy exercises, including 'swimmers'. - Consider x-ray if no improvement.  Right knee osteoarthritis, status post left knee replacement Right knee osteoarthritis managed  with brace, awaiting follow-up with orthopedic surgeon. Previous injection ineffective. - Continue knee brace use, especially at work. - Follow up with Dr. Linnea in October.  Epilepsy Epilepsy with scheduled follow-up with neurologist. - Follow up with Dr. Georjean in October or November.  Subjective short-term memory loss Reported short-term memory loss without concentration issues. Symptoms present for less than a year. Further assessment needed. - Administered MMSE - pt scored 23/30. Difficulty with recall - Will repeat MRI brain.  - referral placed to neuropsych for additional evaluation and workup        No follow-ups on file.   Benton LITTIE Gave, PA

## 2024-05-03 NOTE — Patient Instructions (Addendum)
   You have cervical trigger point, which is knots in the muscles of the neck. Please apply a warm moist compress, such as a microwavable heating pack, to your neck several times daily. After each warm compress, apply the technique that we discussed today of ischemic release. This is a prolonged, deep pressure into the knot of the muscle to release the tension. Or use the device noted above  Take the muscle relaxer three times daily as needed. Keep in mind it may make you feel tired or drowsy, so do not operate machinery or drive a car until you know how it affects you.  Please take the anti-inflammatory medication called in today once daily with food. Do not take any additional OTC NSAIDS (advil, motrin, ibuprofen, aleve, naproxen).    If your symptoms persist, you would be a candidate for trigger point injection or dry needling.  Try to stay hydrated with WATER as dehydration and caffeine intake can worsen this condition.    I have ordered an MRI and placed a referral to neuropsych for additional testing regarding your memory.

## 2024-05-17 ENCOUNTER — Telehealth: Payer: Self-pay | Admitting: Neurology

## 2024-05-17 ENCOUNTER — Other Ambulatory Visit: Payer: Self-pay

## 2024-05-17 DIAGNOSIS — G43709 Chronic migraine without aura, not intractable, without status migrainosus: Secondary | ICD-10-CM

## 2024-05-17 DIAGNOSIS — G40309 Generalized idiopathic epilepsy and epileptic syndromes, not intractable, without status epilepticus: Secondary | ICD-10-CM

## 2024-05-17 MED ORDER — TOPIRAMATE 50 MG PO TABS: 150.0000 mg | ORAL_TABLET | Freq: Two times a day (BID) | ORAL | 3 refills | Status: DC

## 2024-05-17 MED ORDER — OXCARBAZEPINE 600 MG PO TABS: 600.0000 mg | ORAL_TABLET | Freq: Two times a day (BID) | ORAL | 3 refills | Status: DC

## 2024-05-17 MED ORDER — LAMOTRIGINE 100 MG PO TABS: 100.0000 mg | ORAL_TABLET | Freq: Two times a day (BID) | ORAL | 3 refills | Status: DC

## 2024-05-17 NOTE — Telephone Encounter (Signed)
 Left a message with the after hour service on 05-17-24 at 12:128 pm  Caller states that she needs all of her medication transferred to a Different pharmacy they ddi not get the name of the medication or pharmacy

## 2024-05-17 NOTE — Telephone Encounter (Signed)
 Sent for approval to Dr.Aquino

## 2024-05-26 DIAGNOSIS — Z419 Encounter for procedure for purposes other than remedying health state, unspecified: Secondary | ICD-10-CM | POA: Diagnosis not present

## 2024-05-29 ENCOUNTER — Other Ambulatory Visit

## 2024-06-12 ENCOUNTER — Ambulatory Visit: Payer: Self-pay | Admitting: Urgent Care

## 2024-06-12 ENCOUNTER — Ambulatory Visit

## 2024-06-12 DIAGNOSIS — R413 Other amnesia: Secondary | ICD-10-CM | POA: Diagnosis not present

## 2024-06-12 MED ORDER — GADOBUTROL 1 MMOL/ML IV SOLN
6.6000 mL | Freq: Once | INTRAVENOUS | Status: AC | PRN
  Administered 2024-06-12: 6.6 mL via INTRAVENOUS

## 2024-06-19 ENCOUNTER — Telehealth: Payer: Self-pay

## 2024-06-19 NOTE — Telephone Encounter (Signed)
 Copied from CRM [id number]. Topic: Clinical - Lab/Test Results >> Jun 16, 2024  1:17 PM Santiya F wrote: Reason for CRM: Patient is calling in requesting the results from her MRI. Please follow up with patient.

## 2024-06-20 ENCOUNTER — Ambulatory Visit: Payer: Self-pay

## 2024-06-20 NOTE — Telephone Encounter (Signed)
 Patient informed of message from [place], [place] and recommendations to address with Neurology in November.

## 2024-06-20 NOTE — Telephone Encounter (Signed)
 Please notify pt that there was a small amount of atrophy (shrinkage) in one portion of the MRI which is UNCHANGED from her MRI in 2022. There are no additional findings. The cause of the atrophy should be discussed with her neurologist in Nov.

## 2024-06-20 NOTE — Telephone Encounter (Signed)
 FYI Only or Action Required?: Action required by provider: clinical question for provider.  Patient was last seen in primary care on 05/03/2024 by Laurie Benton CROME, PA.  Called Nurse Triage reporting Imaging Results.  Symptoms began ongoing.  Triage Disposition: Call PCP Within 24 Hours  Patient/caregiver understands and will follow disposition?: No, wishes to speak with PCP   Copied from CRM #[id number]. Topic: Clinical - Lab/Test Results >> Jun 20, 2024  8:53 AM Laurie Silva wrote: Reason for CRM: Patient needs clarity on MRI Reason for Disposition  [1] Follow-up call from patient regarding patient's clinical status AND [2] information NON-URGENT  Answer Assessment - Initial Assessment Questions 1. REASON FOR CALL or QUESTION: What is your reason for calling today? or How can I best     Patient wants to go into detail with someone about MRI - patient asking if we can send MRI from neurologist and wants a call back to discuss at main number [phone]. Patient states she has questions regarding neurology referral.    2. CALLER: Document the source of call. (e.Silva., laboratory staff, caregiver or patient).     Patient  Protocols used: PCP Call - No Triage-A-AH

## 2024-06-30 DIAGNOSIS — M1711 Unilateral primary osteoarthritis, right knee: Secondary | ICD-10-CM | POA: Diagnosis not present

## 2024-06-30 DIAGNOSIS — Z96652 Presence of left artificial knee joint: Secondary | ICD-10-CM | POA: Diagnosis not present

## 2024-07-10 ENCOUNTER — Ambulatory Visit: Admitting: Neurology

## 2024-07-13 ENCOUNTER — Telehealth: Payer: Self-pay | Admitting: Neurology

## 2024-07-13 ENCOUNTER — Telehealth: Payer: Self-pay

## 2024-07-13 ENCOUNTER — Ambulatory Visit
Admission: EM | Admit: 2024-07-13 | Discharge: 2024-07-13 | Disposition: A | Discharge status: Home / Self Care | Attending: Family Medicine | Admitting: Family Medicine

## 2024-07-13 ENCOUNTER — Other Ambulatory Visit: Payer: Self-pay

## 2024-07-13 DIAGNOSIS — U071 COVID-19: Secondary | ICD-10-CM

## 2024-07-13 HISTORY — DX: Epilepsy, unspecified, not intractable, without status epilepticus: G40.909

## 2024-07-13 LAB — POC SOFIA SARS ANTIGEN FIA: SARS Coronavirus 2 Ag: POSITIVE — AB

## 2024-07-13 LAB — POCT INFLUENZA A/B
Influenza A, POC: NEGATIVE
Influenza B, POC: NEGATIVE

## 2024-07-13 NOTE — Telephone Encounter (Signed)
 Pt was diagnosed with COVID and would like a call back on what she can take for her cough that will not cause an issue with her Epilepsy Rx's also she may need to resched 2 weeks out from covid diagnosis

## 2024-07-13 NOTE — Telephone Encounter (Signed)
 LM for pt to return call to see if she wanted to come in for a nurse visit today to be tested for anything

## 2024-07-13 NOTE — Discharge Instructions (Addendum)
 Drink lots of fluids May take over-the-counter cough and cold medicines as needed for symptom May take Tylenol or ibuprofen as needed for pain and fever You may stay home from work until you have no fever and your symptoms have improved for 24 hours Call the work associates from yesterday to inform of Covid diagnosis Wear mask as long as you have symptoms

## 2024-07-13 NOTE — ED Provider Notes (Signed)
 [facility]    CSN: [id number] Arrival date & time: 07/13/24  9050      History   Chief Complaint Chief Complaint  Patient presents with   Cough    HPI Laurie ADDUCI is a 58 y.o. female.   Patient is on day 3 of a cough and cold illness.  She feels tired.  Chest is congested.  She states she worked last night.  She is here today because she is feeling more body aches and tiredness.  No known exposure to illness. Patient states that she does have a seizure disorder that previously has been difficult to control.  She is under the care of neurology    Past Medical History:  Diagnosis Date   Epilepsy (HCC)    Glaucoma    Hx of migraines    Macular degeneration    Pulmonary embolism (HCC)    Seizures (HCC)     Patient Active Problem List   Diagnosis Date Noted   Epilepsy (HCC) 04/14/2023   Screening-pulmonary TB 04/14/2023   Plantar fasciitis of right foot 03/08/2023   Stress 03/08/2023   Diarrhea 07/09/2022    Past Surgical History:  Procedure Laterality Date   BREAST BIOPSY Left    per pt   ENDOMETRIAL ABLATION     TUBAL LIGATION      OB History     Gravida  5   Para      Term      Preterm      AB  3   Living  2      SAB      IAB      Ectopic      Multiple      Live Births               Home Medications    Prior to Admission medications   Medication Sig Start Date End Date Taking? Authorizing Provider  lamoTRIgine  (LAMICTAL ) 100 MG tablet Take 1 tablet (100 mg total) by mouth 2 (two) times daily. 05/17/24   Georjean Darice HERO, MD  oxcarbazepine  (TRILEPTAL ) 600 MG tablet Take 1 tablet (600 mg total) by mouth 2 (two) times daily. 05/17/24   Georjean Darice HERO, MD  topiramate  (TOPAMAX ) 50 MG tablet Take 3 tablets (150 mg total) by mouth 2 (two) times daily. 05/17/24   Georjean Darice HERO, MD    Family History Family History  Problem Relation Age of Onset   Stroke Father    Dementia Father    Diabetes Father    Glaucoma  Father    Breast cancer Mother    Macular degeneration Mother     Social History Social History   Tobacco Use   Smoking status: Never   Smokeless tobacco: Never  Vaping Use   Vaping status: Never Used  Substance Use Topics   Alcohol use: Not Currently    Comment: occ   Drug use: Never     Allergies   Brinzolamide-brimonidine, Neomycin, Valproic acid, Cefaclor, Hydrocodone-acetaminophen, Latex, and Phenobarbital   Review of Systems Review of Systems  See HPI Physical Exam Triage Vital Signs ED Triage Vitals  Encounter Vitals Group     BP 07/13/24 0958 115/77     Girls Systolic BP Percentile --      Girls Diastolic BP Percentile --      Boys Systolic BP Percentile --      Boys Diastolic BP Percentile --      Pulse Rate 07/13/24 0958 82  Resp 07/13/24 0958 18     Temp 07/13/24 0958 98.8 F (37.1 C)     Temp src --      SpO2 07/13/24 0958 95 %     Weight --      Height --      Head Circumference --      Peak Flow --      Pain Score 07/13/24 1002 10     Pain Loc --      Pain Education --      Exclude from Growth Chart --    No data found.  Updated Vital Signs BP 115/77   Pulse 82   Temp 98.8 F (37.1 C)   Resp 18   SpO2 95%       Physical Exam Constitutional:      General: She is not in acute distress.    Appearance: She is well-developed and normal weight. She is ill-appearing.  HENT:     Head: Normocephalic and atraumatic.     Right Ear: Tympanic membrane normal.     Left Ear: Tympanic membrane normal.     Nose: Nose normal.     Mouth/Throat:     Pharynx: Posterior oropharyngeal erythema present.  Eyes:     Conjunctiva/sclera: Conjunctivae normal.     Pupils: Pupils are equal, round, and reactive to light.  Cardiovascular:     Rate and Rhythm: Normal rate and regular rhythm.     Heart sounds: Normal heart sounds.  Pulmonary:     Effort: Pulmonary effort is normal. No respiratory distress.     Breath sounds: Normal breath sounds.   Musculoskeletal:        General: Normal range of motion.     Cervical back: Normal range of motion.  Lymphadenopathy:     Cervical: No cervical adenopathy.  Skin:    General: Skin is warm and dry.  Neurological:     Mental Status: She is alert.      UC Treatments / Results  Labs (all labs ordered are listed, but only abnormal results are displayed) Labs Reviewed  POC SOFIA SARS ANTIGEN FIA - Abnormal; Notable for the following components:      Result Value   SARS Coronavirus 2 Ag Positive (*)    All other components within normal limits  POCT INFLUENZA A/B    EKG   Radiology No results found.  Procedures Procedures (including critical care time)  Medications Ordered in UC Medications - No data to display  Initial Impression / Assessment and Plan / UC Course  I have reviewed the triage vital signs and the nursing notes.  Pertinent labs & imaging results that were available during my care of the patient were reviewed by me and considered in my medical decision making (see chart for details).     Because of patient's seizure disorder I considered giving her Paxlovid.  Her GFR is over 80.  Upon further investigation, however, it appears that it may interfere with her Lamictal .  Discussed with patient and a decision was made not to give her antivirals. Final Clinical Impressions(s) / UC Diagnoses   Final diagnoses:  COVID     Discharge Instructions      Drink lots of fluids May take over-the-counter cough and cold medicines as needed for symptom May take Tylenol or ibuprofen as needed for pain and fever You may stay home from work until you have no fever and your symptoms have improved for 24 hours Call the work associates  from yesterday to inform of Covid diagnosis Wear mask as long as you have symptoms   ED Prescriptions   None    PDMP not reviewed this encounter.   Maranda Jamee Jacob, MD 07/13/24 [id number]

## 2024-07-13 NOTE — Telephone Encounter (Signed)
 Copied from CRM [id number]. Topic: Clinical - Red Word Triage >> Jul 13, 2024  8:17 AM Olam RAMAN wrote: Red Word that prompted transfer to Nurse Triage: Pt has been ill  since Monday, bad cough, stuffy nose, congestion. Doesnt know if she has fever, has headaches PT REFUSED NT

## 2024-07-13 NOTE — ED Triage Notes (Signed)
 Has c/o cough, ha, congestion since Monday. Has not checked temperature. No otc meds d/t having epilepsy.

## 2024-07-14 NOTE — Telephone Encounter (Signed)
 She can mostly take any cough medication, would just avoid Benadryl.

## 2024-07-14 NOTE — Telephone Encounter (Signed)
 Called and left a message on voice mail per Dr. Georjean. Told to call office back with any questions.

## 2024-07-17 NOTE — Telephone Encounter (Signed)
 Pt called an informed Dr Georjean said She can mostly take any cough medication, would just avoid Benadryl

## 2024-07-19 ENCOUNTER — Encounter: Payer: Self-pay | Admitting: Neurology

## 2024-07-19 ENCOUNTER — Ambulatory Visit: Admitting: Neurology

## 2024-07-19 VITALS — BP 116/77 | HR 52 | Wt 142.8 lb

## 2024-07-19 DIAGNOSIS — R413 Other amnesia: Secondary | ICD-10-CM

## 2024-07-19 DIAGNOSIS — G40309 Generalized idiopathic epilepsy and epileptic syndromes, not intractable, without status epilepticus: Secondary | ICD-10-CM

## 2024-07-19 NOTE — Patient Instructions (Signed)
 Good to see you.  Schedule 1-hour EEG  2. Continue all your medications for now. We will call you with EEG results and next steps  3. Follow-up in 3 months, call for any changes  Seizure Precautions: 1. If medication has been prescribed for you to prevent seizures, take it exactly as directed.  Do not stop taking the medicine without talking to your doctor first, even if you have not had a seizure in a long time.   2. Avoid activities in which a seizure would cause danger to yourself or to others.  Don't operate dangerous machinery, swim alone, or climb in high or dangerous places, such as on ladders, roofs, or girders.  Do not drive unless your doctor says you may.  3. If you have any warning that you may have a seizure, lay down in a safe place where you can't hurt yourself.    4.  No driving for 6 months from last seizure, as per [place]  state law.   Please refer to the following link on the Epilepsy Foundation of America's website for more information: http://www.epilepsyfoundation.org/answerplace/Social/driving/drivingu.cfm   5.  Maintain good sleep hygiene. Avoid alcohol.  6.  Contact your doctor if you have any problems that may be related to the medicine you are taking.  7.  Call 911 and bring the patient back to the ED if:        A.  The seizure lasts longer than 5 minutes.       B.  The patient doesn't awaken shortly after the seizure  C.  The patient has new problems such as difficulty seeing, speaking or moving  D.  The patient was injured during the seizure  E.  The patient has a temperature over 102 F (39C)  F.  The patient vomited and now is having trouble breathing

## 2024-07-19 NOTE — Progress Notes (Signed)
 NEUROLOGY FOLLOW UP OFFICE NOTE  Laurie Silva [id number] 04-Jul-1966  HISTORY OF PRESENT ILLNESS: I had the pleasure of seeing Laurie Silva in follow-up in the neurology clinic on 07/19/2024.  The patient was last seen 5 months ago for generalized epilepsy and chronic migraine. She is alone in the office today. Records and images were personally reviewed where available.  On her last visit, she was reporting memory changes. There was a lot of stress and anxiety with her home situation. We discussed different causes of memory changes. TSH and B12 were normal. I personally reviewed MRI brain with and without contrast done 05/2024 which did not show any acute changes, hippocampi symmetric. There was again note of inferior cerebellar and inferior vermian atrophy, unchanged from 2022 imaging. She was restarted on Cymbalta  on last visit for mood/sleep, she states she took it for a little while then realized her symptoms were more due to perimenopause so she stopped medication and feels things are better even without medication. She still does not sleep the full 8 hours but sleep is a lot better. She denies any seizures since 2008 on Lamotrigine  100mg  BID, Oxcarbazepine  600mg  BID, and Topiramate  150mg  BID (50mg  3 tabs BID). She continues to note memory changes, her daughter has expressed concern as well. She reports it took her 4 hours to get home on her last visit. She has a big issue with forgetting medications. She feels stress is better, she is looking forward to April 2026 when she gets to sign paperwork to finalize separation from her husband. She lives alone with family coming to help her.    History on Initial Assessment 07/01/2021: This is a 58 year old right-handed woman with a history of pulmonary embolism, migraines, and generalized epilepsy, presenting to establish care. Records from her epileptologist Dr. Hezekiah were reviewed. She was previously followed by Dr. Caron Silva and Dr. Rexie.  Seizures started at age 58. She had staring spells as a child. She denies any myoclonic jerks. No warning prior to her convulsions, she states her last seizure was in 2008. EMU evaluation in 2007 was abnormal with generalized spike and wave discharges, at which time one GTCS was captured during the admission. There is a brain MRI with and without contrast done 06/2019 which was normal. She has tried multiple ASMs in the past, including Dilantin (ineffective), Phenobarbital (ineffective), Keppra (ineffective), Tegretol (initially worked), Neurontin, Zonegran, Zarontin, Depakote (rash), and Lamictal  (rash). She had a long history of being on brand name medication but switched to generics due to economic issues with no side effects or breakthrough seizures. She has been on chronic treatment with combination Lamotrigine  100mg  BID, Oxcarbazepine  600mg  BID, and Topiramate  50mg  3 tabs BID (150mg  BID) without side effects. We discussed note on her chart of last seizure being in 2019, and clarified that it was not a seizure, she had a viral illness at that time and Dr. Hezekiah' note in 12/2017 indicated history most compatible with syncope related to viral illness and neuropathy making her unstable.  She lives with her husband and is his primary caregiver causing significant amount of stress. She denies any staring/unresponsive episodes, gaps in time, olfactory/gustatory hallucinations, deja vu, rising epigastric sensation, focal numbness/tingling/weakness, myoclonic jerks. She has a history of migraines and has had chronic daily headaches for at least a year, taking 2 Tylenol every night for the past year. Pain is diffuse, there is no associated nausea/vomiting. When pain is severe, she has sensitivity to lights and sounds. She  has a headache now. She has occasional dizziness. She states her back always hurts. No bowel/bladder dysfunction. She brings notes from Dr. 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 Memory is just like any 58 year old. She denies missing medications regularly, denies getting lost driving. She states there is a lot going on at home, she does not sleep, if she gets 3 hours of sleep she is good. Mood is stressed. She works part-time as an government social research officer.   Epilepsy Risk Factors:  Her daughter had seizures at age 31 that she outgrew, her 28 year old granddaughter has epilepsy. She recalls a pneumonia with a fever of 107 at age 58 but does not recall febrile convulsions. She had a normal birth and early development.  There is no history of CNS infections such as meningitis/encephalitis, significant traumatic brain injury, neurosurgical procedures  Prior ASMs: Dilantin (ineffective), Phenobarbital (ineffective), Keppra (ineffective), Tegretol (initially worked), Neurontin, Zonegran, Zarontin, Depakote (rash), and Lamictal  (rash) Prior headache preventative medications: Depakote, Zonisamide, Neurontin, Cymbalta , nortriptyline    Diagnostic Data: EEGs:EMU evaluation in 2007 was abnormal with generalized spike and wave discharges, at which time one GTCS was captured during the admission.  FMP:Uyzmz is a brain MRI with and without contrast done 06/2019 which was normal.   PAST MEDICAL HISTORY: Past Medical History:  Diagnosis Date   COVID 2025   Epilepsy (HCC)    Glaucoma    Hx of migraines    Macular degeneration    Pulmonary embolism (HCC)    Seizures (HCC)     MEDICATIONS: Current Outpatient Medications on File Prior to Visit  Medication Sig Dispense Refill   lamoTRIgine  (LAMICTAL ) 100 MG tablet Take 1 tablet (100 mg total) by mouth 2 (two) times daily. 180 tablet 3   oxcarbazepine  (TRILEPTAL ) 600 MG tablet Take 1 tablet (600 mg total) by mouth 2 (two) times daily. 180 tablet 3   topiramate  (TOPAMAX ) 50 MG tablet Take 3 tablets (150 mg total) by mouth 2 (two) times daily.  540 tablet 3   No current facility-administered medications on file prior to visit.    ALLERGIES: Allergies  Allergen Reactions   Brinzolamide-Brimonidine Anaphylaxis    itching   Neomycin Anaphylaxis and Swelling   Valproic Acid Hives, Rash and Other (See Comments)    insomnia    Cefaclor Rash    Other reaction(s): Unknown   Hydrocodone-Acetaminophen Rash   Latex Rash   Phenobarbital Other (See Comments)    agitation    FAMILY HISTORY: Family History  Problem Relation Age of Onset   Stroke Father    Dementia Father    Diabetes Father    Glaucoma Father    Breast cancer Mother    Macular degeneration Mother     SOCIAL HISTORY: Social History   Socioeconomic History   Marital status: Married    Spouse name: Not on file   Number of children: 4   Years of education: Not on file   Highest education level: Not on file  Occupational History   Occupation: CONSERVATION OFFICER, [employer]  Tobacco Use   Smoking status: Never   Smokeless tobacco: Never  Vaping Use   Vaping status: Never Used  Substance and Sexual Activity   Alcohol use: Not Currently    Comment: occ   Drug use: Never   Sexual activity: Not Currently  Other Topics Concern   Not on file  Social History Narrative  Right Handed    Lives in a two story home but resides on the first floor    Drinks no caffeine    Social Drivers of Corporate Investment Banker Strain: Not on file  Food Insecurity: Not on file  Transportation Needs: Not on file  Physical Activity: Not on file  Stress: Not on file  Social Connections: Unknown (01/17/2022)   Received from [facility]   Social Network    Social Network: Not on file  Intimate Partner Violence: Unknown (12/16/2021)   Received from [facility]   HITS    Physically Hurt: Not on file    Insult or Talk Down To: Not on file    Threaten Physical Harm: Not on file    Scream or Curse: Not on file     PHYSICAL EXAM: Vitals:   07/19/24 1122  BP: 116/77  Pulse: (!) 52   SpO2: 100%   General: No acute distress Head:  Normocephalic/atraumatic Skin/Extremities: No rash, no edema Neurological Exam: alert and awake. No aphasia or dysarthria. Fund of knowledge is appropriate. Attention and concentration are normal.   Cranial nerves: Pupils equal, round. Extraocular movements intact with no nystagmus. Visual fields full.  No facial asymmetry.  Motor: Bulk and tone normal, muscle strength 5/5 throughout with no pronator drift.   Finger to nose testing intact.  Gait narrow-based and steady, able to tandem walk adequately.  Romberg negative.   IMPRESSION: This is a 58 yo RH woman with a history of pulmonary embolism, migraines, and generalized epilepsy. She has been seizure-free since 2008 on Lamotrigine  100mg  BID, oxcarbazepine  600mg  BID, and Topiramate  150mg  BID. She continues to report cognitive changes, TSH and B12 normal. MRI brain unchanged from 2022. We discussed how Topiramate  can cause cognitive changes, but also how subclinical seizures can also affect memory. We will do a 1-hour EEG to assess for subclinical seizures. If normal, we will plan to gradually reduce Topiramate  by 50mg  every week and monitor cognitive changes. With reduction in Topiramate , we will plan to increase Lamotrigine  to 200mg  BID. She is aware of [place] driving laws to stop driving after a seizure until 6 months seizure-free. Follow-up in 3 months, call for any changes.   Thank you for allowing me to participate in her care.  Please do not hesitate to call for any questions or concerns.    Darice Shivers, M.D.   CC: Dr. Bevin, [place] Gave, [place]

## 2024-07-27 ENCOUNTER — Ambulatory Visit: Admitting: Neurology

## 2024-07-27 DIAGNOSIS — G40309 Generalized idiopathic epilepsy and epileptic syndromes, not intractable, without status epilepticus: Secondary | ICD-10-CM

## 2024-07-27 DIAGNOSIS — R413 Other amnesia: Secondary | ICD-10-CM

## 2024-07-27 NOTE — Progress Notes (Signed)
 EEG complete and ready for review.

## 2024-07-31 NOTE — Procedures (Signed)
 ELECTROENCEPHALOGRAM REPORT  Date of Study: 07/27/2024  Patient's Name: Laurie Silva MRN: [id number] Date of Birth: 02/12/1966  Referring Provider: Dr. Darice Shivers  Clinical History: This is a 58 year old woman with epilepsy reporting worsening memory. EEG to assess for subclinical seizures.  Medications: Lamictal , Trileptal , Topiramate   Technical Summary: A multichannel digital EEG recording measured by the international 10-20 system with electrodes applied with paste and impedances below 5000 ohms performed in our laboratory with EKG monitoring in an awake and asleep patient.  Hyperventilation and photic stimulation were not performed.  The digital EEG was referentially recorded, reformatted, and digitally filtered in a variety of bipolar and referential montages for optimal display.    Description: The patient is awake and asleep during the recording.  During maximal wakefulness, there is a symmetric, medium voltage 9 Hz posterior dominant rhythm that attenuates with eye opening.  The record is symmetric.  During drowsiness and sleep, there is an increase in theta slowing of the background.  Vertex waves and symmetric sleep spindles were seen. There were frequent generalized high voltage 4 Hz spike and polyspike and wave discharges with frontal predominance, occurring in bursts and runs up to 5 seconds. No electrographic seizures seen.    EKG lead was unremarkable.  Impression: This awake and asleep EEG is abnormal due to the presence of frequent bursts and runs of generalized high voltage 4 Hz spike and polyspike and wave discharges with frontal predominance.  Clinical Correlation  of the above findings is consistent with the interictal expression of a generalized epilepsy. Patient had a headache during the study but no clear clinical changes seen. If further clinical questions remain, prolonged EEG may be helpful.  Clinical correlation is advised.   Darice Shivers, M.D.

## 2024-08-01 ENCOUNTER — Ambulatory Visit: Payer: Self-pay | Admitting: Neurology

## 2024-08-01 DIAGNOSIS — G40309 Generalized idiopathic epilepsy and epileptic syndromes, not intractable, without status epilepticus: Secondary | ICD-10-CM

## 2024-08-01 MED ORDER — LAMOTRIGINE 100 MG PO TABS: ORAL_TABLET | ORAL | 3 refills | Status: DC

## 2024-08-01 NOTE — Telephone Encounter (Signed)
 Spoke to patient about EEG. Discussed increasing the Lamotrigine  100mg  in AM, 200mg  in PM for 1 week, then increase to 200mg  BID. She was advised to continue to monitor memory and symptoms as we increase dose. If no change in memory, we will plan to gradually reduce the Topiramate .

## 2024-08-08 ENCOUNTER — Telehealth: Payer: Self-pay | Admitting: Neurology

## 2024-08-08 NOTE — Telephone Encounter (Signed)
 Pt called an informed that Dr Georjean is out of the office. She just wanted to let Dr Georjean know that today she will be increasing her Lamictal  to 200mg  BID and yesterday she had a bad day with confusion ( it was a really bad day) she just wanted Dr Georjean know when she returns when they start whining her off the topiramate .

## 2024-08-08 NOTE — Telephone Encounter (Signed)
 Who's calling (name and relationship to patient) : Suzen Hopping; Self   Best contact number: [phone]  Provider they see: Dr. Georjean   Reason for call: Dacie called in not sure if Dr.Aquino would be calling her today. She stated that she had a bad day yesterday and this morning as well. Need to speak with her regarding meds. She would like to know the exact time that she would get a call back to let her boss know.   Work hrs today are 10:30 am to 6: 30 pm     Call ID:      PRESCRIPTION REFILL ONLY  Name of prescription:  Pharmacy:

## 2024-08-14 NOTE — Telephone Encounter (Signed)
 We will do reduction very slowly. She is currently on Topiramate  50mg  3 tabs BID. Pls have her reduce to 2 tabs in AM, 3 tabs in PM for 1 week, then reduce to 2 tabs twice a day. Stay on this dose for another week and update our office how she is doing. Thanks

## 2024-08-15 NOTE — Telephone Encounter (Signed)
 Pt called an informed that We will do reduction very slowly. She is currently on Topiramate  50mg  3 tabs BID. Pls have her reduce to 2 tabs in AM, 3 tabs in PM for 1 week, then reduce to 2 tabs twice a day. Stay on this dose for another week and update our office how she is doing. Pt made a note of how she would be taken her medication and verbalized understanding,

## 2024-08-30 ENCOUNTER — Telehealth: Payer: Self-pay | Admitting: Neurology

## 2024-08-30 DIAGNOSIS — G40309 Generalized idiopathic epilepsy and epileptic syndromes, not intractable, without status epilepticus: Secondary | ICD-10-CM

## 2024-08-30 DIAGNOSIS — G43709 Chronic migraine without aura, not intractable, without status migrainosus: Secondary | ICD-10-CM

## 2024-08-30 NOTE — Telephone Encounter (Signed)
 Laurie Silva called in stating that she called in yesterday and did not hear back from the nurse. She stated that Dr.Aquino is suppose to be changing her meds. She stated this is the 4th week of slowly taking off a pill at a time.   PH: [phone]

## 2024-08-30 NOTE — Telephone Encounter (Signed)
 Topamax  is down to 2 tablets BID has had some ready bad headaches this week took Advil and it helped.(Last headache was sat.)  Her memory is better and her sleep is better, she needs refills of her medication she is asking how you want her to continue to take it,  She has had some jerking episodes 3-4 in a row said it felt like she was being shocked started some last week more this week,  She has not missed any medication.

## 2024-08-31 MED ORDER — TOPIRAMATE 50 MG PO TABS: ORAL_TABLET | ORAL | 3 refills | Status: DC

## 2024-08-31 NOTE — Telephone Encounter (Signed)
 With memory and sleep being better on 2 tabs BID, and with the jerking episodes and headaches, I am hesitant to continue reducing Topamax  for now. I would stay on it for another 2 weeks and monitor if the jerking and headaches stabilize. If they stop happening, we can continue reducing Topamax . If she is okay with this, pls send in refills for Topiramate  2 tabs BID, thanks

## 2024-08-31 NOTE — Telephone Encounter (Signed)
 Pt called an informed that With memory and sleep being better on 2 tabs BID, and with the jerking episodes and headaches, I am hesitant to continue reducing Topamax  for now. I would stay on it for another 2 weeks and monitor if the jerking and headaches stabilize. If they stop happening, we can continue reducing Topamax . If she is okay with this, pls send in refills for Topiramate  2 tabs BID,

## 2024-09-01 ENCOUNTER — Ambulatory Visit: Admitting: Neurology

## 2024-09-26 ENCOUNTER — Telehealth: Payer: Self-pay | Admitting: Neurology

## 2024-09-26 DIAGNOSIS — G43709 Chronic migraine without aura, not intractable, without status migrainosus: Secondary | ICD-10-CM

## 2024-09-26 DIAGNOSIS — G40309 Generalized idiopathic epilepsy and epileptic syndromes, not intractable, without status epilepticus: Secondary | ICD-10-CM

## 2024-09-26 NOTE — Telephone Encounter (Signed)
 Pt called wanted to know if Dr Georjean would like to decrease topiramate  (TOPAMAX ) 50 MG tablet anymore. She stated that she is doing better her memory is better she has had some headaches and a few spells but she said she is better. She does need refills,

## 2024-09-26 NOTE — Telephone Encounter (Signed)
 Pt called in this morning and she wants a return call to discuss her medications. Thanks

## 2024-09-27 MED ORDER — TOPIRAMATE 50 MG PO TABS: ORAL_TABLET | ORAL | 3 refills | Status: DC

## 2024-09-27 NOTE — Telephone Encounter (Signed)
 Glad she is doing better. I would like to stay on current dose Topiramate  50mg : 2 tabs twice a day until her follow-up next month, then we can continue re-evaluate. Thanks

## 2024-09-27 NOTE — Telephone Encounter (Signed)
 Pt called no answer left a voice mail to call the office back

## 2024-09-27 NOTE — Telephone Encounter (Signed)
 Pt called an informed Glad she is doing better. Dr Georjean  would like to stay on current dose Topiramate  50mg : 2 tabs twice a day until her follow-up next month, then we can continue re-evaluate pt verbalized understanding

## 2024-10-10 ENCOUNTER — Telehealth: Payer: Self-pay | Admitting: Neurology

## 2024-10-10 NOTE — Telephone Encounter (Signed)
 I called pt she stated at 1st she has not had any seizures, she said that she was at a hotel for work and ended up at [employer] confused and she vomited because of headache they took her home. The past few days her memory has been difficult. She has missed a few doses of medications. She was out of it could not walk she took 2 doses of meds yesterday morning because she didn't remember taken it Monday morning. She is worried if her problems are because she took to much medication? I asked her if she has a pill box? She said yes but she takes her medication out of the wrong day and then she will go back and take the right day.  Before we got off the phone she stated that she is having seizures at night she stated that they are little ones

## 2024-10-10 NOTE — Telephone Encounter (Signed)
 Patient called and wants to speak with a nurse about what happened yesterday. She states that her medication has been changing

## 2024-10-11 NOTE — Telephone Encounter (Signed)
 Pt called informed that Dr Georjean is concerned that this was a seizure. Pt is going to do a VV Monday

## 2024-10-11 NOTE — Telephone Encounter (Signed)
 I am concerned that this was a seizure. Can she do a VV on Monday at 3:40pm?

## 2024-10-16 ENCOUNTER — Telehealth: Payer: Self-pay | Admitting: Neurology

## 2024-10-16 ENCOUNTER — Encounter: Payer: Self-pay | Admitting: Neurology

## 2024-10-16 DIAGNOSIS — G40309 Generalized idiopathic epilepsy and epileptic syndromes, not intractable, without status epilepticus: Secondary | ICD-10-CM

## 2024-10-16 DIAGNOSIS — G43709 Chronic migraine without aura, not intractable, without status migrainosus: Secondary | ICD-10-CM

## 2024-10-16 MED ORDER — LAMOTRIGINE 200 MG PO TABS: 200.0000 mg | ORAL_TABLET | Freq: Two times a day (BID) | ORAL | 3 refills | Status: AC

## 2024-10-16 MED ORDER — OXCARBAZEPINE 600 MG PO TABS: 600.0000 mg | ORAL_TABLET | Freq: Two times a day (BID) | ORAL | 3 refills | Status: AC

## 2024-10-16 MED ORDER — TOPIRAMATE 50 MG PO TABS: ORAL_TABLET | ORAL | 11 refills | Status: AC

## 2024-10-16 MED ORDER — AIMOVIG 140 MG/ML ~~LOC~~ SOAJ: 1.0000 | SUBCUTANEOUS | 11 refills | Status: AC

## 2024-10-16 NOTE — Patient Instructions (Signed)
 Good to see you.  We will switch to Lamotrigine  200mg : take 1 tablet twice a day  2. Start Aimovig  injection once a month to help cut down on the headaches. Start reducing Ibuprofen/Tylenol to 3 a week otherwise headaches will be harder to treat  3. Continue Topiramate  50mg : Take 2 tablets twice a day  4. Continue Oxcarbazepine  600mg : take 1 tablet twice a day  5. Follow-up in 3 months, call for any changes

## 2024-10-17 ENCOUNTER — Telehealth: Payer: Self-pay

## 2024-10-17 ENCOUNTER — Telehealth: Payer: Self-pay | Admitting: Neurology

## 2024-10-17 NOTE — Telephone Encounter (Signed)
 Pt needs a PA for Aimovig 

## 2024-10-17 NOTE — Telephone Encounter (Signed)
 [facility] Call ID: [id number]  Caller left message with AN regarding appt.  FYI: called pt back, she stated per Georjean a nurse will be giving her a call back regarding changes, that would need to be approved through insurance first.

## 2024-10-18 ENCOUNTER — Other Ambulatory Visit (HOSPITAL_COMMUNITY): Payer: Self-pay

## 2024-10-18 ENCOUNTER — Telehealth: Payer: Self-pay | Admitting: Pharmacy Technician

## 2024-10-18 ENCOUNTER — Telehealth: Payer: Self-pay | Admitting: Neurology

## 2024-10-18 NOTE — Telephone Encounter (Signed)
 PA has been submitted, and telephone encounter has been created. Please see telephone encounter dated 2.4.26.

## 2024-10-18 NOTE — Telephone Encounter (Signed)
 Pharmacy Patient Advocate Encounter   Received notification from Pt Calls Messages that prior authorization for AIMOVIG  140MG  is required/requested.   Insurance verification completed.   The patient is insured through [facility] MEDICAID.   Per test claim: PA required; PA submitted to above mentioned insurance via Latent Key/confirmation #/EOC [id number] Status is pending

## 2024-10-18 NOTE — Telephone Encounter (Signed)
 Pls let her know I sent in the updated prescription for Lamotrigine  200mg  tablet: take 1 tablet twice a day. Thanks

## 2024-10-18 NOTE — Telephone Encounter (Signed)
 Called patient and relayed the information regarding her lamotrigine . While we were talking she asked if she is to go back to taking 3 Topamax  in the AM and PM . I didn't see it in your notes and wanted to verify

## 2024-10-18 NOTE — Telephone Encounter (Signed)
 Pt called to make sure Dr.Aquino changed her lamictal  to 200mg . She was taking 100mg .

## 2024-10-19 NOTE — Telephone Encounter (Signed)
 Called left patient message to call office so we can go over her medications together

## 2024-10-19 NOTE — Telephone Encounter (Signed)
 Stay on Topiramate  50mg : Take 2 tablets twice a day. Please ask her to repeat instructions, she gets confused easily. She should be on Topiramate  50mg : 2 tablets in AM, 2 tablets in PM, Lamotrigine  200mg :  1 tablet in AM, 1 tablet in PM (This is a new prescription, she was previously on 100mg  tablets), and Oxcarbazepine  600mg : 1 tablet in AM, 1 tablet in PM.  Thanks

## 2024-10-25 ENCOUNTER — Ambulatory Visit: Admitting: Neurology

## 2025-01-22 ENCOUNTER — Telehealth: Admitting: Neurology
